# Patient Record
Sex: Female | Born: 1980 | Race: White | Hispanic: Yes | Marital: Single | Smoking: Never smoker
Health system: Southern US, Community
[De-identification: ages and names within clinical notes are randomized; demographics above are authoritative.]

## PROBLEM LIST (undated history)

## (undated) ENCOUNTER — Inpatient Hospital Stay (HOSPITAL_COMMUNITY): Payer: Self-pay

## (undated) DIAGNOSIS — O24419 Gestational diabetes mellitus in pregnancy, unspecified control: Secondary | ICD-10-CM

## (undated) HISTORY — PX: DILATION AND CURETTAGE OF UTERUS: SHX78

---

## 2003-10-17 ENCOUNTER — Ambulatory Visit (HOSPITAL_COMMUNITY): Admission: RE | Admit: 2003-10-17 | Discharge: 2003-10-17 | Payer: Self-pay | Admitting: Obstetrics and Gynecology

## 2003-12-03 ENCOUNTER — Ambulatory Visit (HOSPITAL_COMMUNITY): Admission: RE | Admit: 2003-12-03 | Discharge: 2003-12-03 | Payer: Self-pay | Admitting: *Deleted

## 2003-12-27 ENCOUNTER — Inpatient Hospital Stay (HOSPITAL_COMMUNITY): Admission: AD | Admit: 2003-12-27 | Discharge: 2003-12-27 | Payer: Self-pay | Admitting: *Deleted

## 2004-01-01 ENCOUNTER — Ambulatory Visit (HOSPITAL_COMMUNITY): Admission: RE | Admit: 2004-01-01 | Discharge: 2004-01-01 | Payer: Self-pay | Admitting: *Deleted

## 2004-01-17 ENCOUNTER — Inpatient Hospital Stay (HOSPITAL_COMMUNITY): Admission: AD | Admit: 2004-01-17 | Discharge: 2004-01-17 | Payer: Self-pay | Admitting: Obstetrics & Gynecology

## 2004-02-20 ENCOUNTER — Inpatient Hospital Stay (HOSPITAL_COMMUNITY): Admission: AD | Admit: 2004-02-20 | Discharge: 2004-02-20 | Payer: Self-pay | Admitting: Obstetrics and Gynecology

## 2004-02-24 ENCOUNTER — Inpatient Hospital Stay (HOSPITAL_COMMUNITY): Admission: AD | Admit: 2004-02-24 | Discharge: 2004-02-26 | Payer: Self-pay | Admitting: Obstetrics and Gynecology

## 2009-12-05 ENCOUNTER — Ambulatory Visit (HOSPITAL_COMMUNITY): Admission: RE | Admit: 2009-12-05 | Discharge: 2009-12-05 | Payer: Self-pay | Admitting: Obstetrics & Gynecology

## 2010-04-30 ENCOUNTER — Ambulatory Visit: Payer: Self-pay | Admitting: Obstetrics & Gynecology

## 2010-04-30 ENCOUNTER — Inpatient Hospital Stay (HOSPITAL_COMMUNITY): Admission: AD | Admit: 2010-04-30 | Discharge: 2010-05-02 | Payer: Self-pay | Admitting: Family Medicine

## 2010-10-08 LAB — ABO/RH: ABO/RH(D): O POS

## 2010-10-08 LAB — CBC
Hemoglobin: 12.9 g/dL (ref 12.0–15.0)
Hemoglobin: 13.2 g/dL (ref 12.0–15.0)
MCH: 34.1 pg — ABNORMAL HIGH (ref 26.0–34.0)
MCH: 34.3 pg — ABNORMAL HIGH (ref 26.0–34.0)
Platelets: 111 10*3/uL — ABNORMAL LOW (ref 150–400)
RBC: 3.78 MIL/uL — ABNORMAL LOW (ref 3.87–5.11)
RBC: 3.85 MIL/uL — ABNORMAL LOW (ref 3.87–5.11)
WBC: 13.3 10*3/uL — ABNORMAL HIGH (ref 4.0–10.5)
WBC: 8.9 10*3/uL (ref 4.0–10.5)

## 2010-10-08 LAB — RPR: RPR Ser Ql: NONREACTIVE

## 2010-12-12 NOTE — Op Note (Signed)
Meagan Grant, PECHACEK                          ACCOUNT NO.:  [id number]   MEDICAL RECORD NO.:  [id number]                   PATIENT TYPE:  INP   LOCATION:  9109                                 FACILITY:  [facility]   PHYSICIAN:  Phil D. Okey Dupre, M.D.                  DATE OF BIRTH:  08-24-1980   DATE OF PROCEDURE:  02/24/2004  DATE OF DISCHARGE:                                 OPERATIVE REPORT   PREOPERATIVE DIAGNOSES:  1. Persistent left occipitoposterior presentation.  2. Failure to descend spontaneously.   POSTOPERATIVE DIAGNOSIS:  1. Persistent left occipitoposterior presentation.  2. Failure to descend spontaneously.   PROCEDURE:  Operative delivery.   SURGEON:  Javier Glazier. Okey Dupre, M.D.   INDICATIONS:  The patient is a 30 year old multiparous Hispanic female who  progressed in labor well until in full dilatation at which time the baby  presented in an LOP presentation with clinically plenty of room yet failure  to descend beyond a +3 station.  For that reason, an operative delivery was  decided upon.   DESCRIPTION OF PROCEDURE:  Two attempts with two pulls each was tried with  the vacuum assistance.  However, because of the sharp angle necessitating to  put this on the occiput, the vacuum would not hold.  It was therefore  decided to do a forceps delivery with the head at +3 and a direct posterior,  the Luikart-McLane forceps were easily applied, and the baby easily  delivered over a midline episiotomy.  At the time of dictation, I do not  have a weight.  The baby had a 8/9 Apgar.  As the baby was being delivered,  the forceps were removed.  The head  was _________ of the head to LOT, and  the baby was then easily delivered.  The cord was doubly clamped and  divided.  The baby had was handed to the pediatrician.  The placenta was  spontaneously removed.  The uterus was explored as well as the vagina, and  the episiotomy repaired with a 2-0 microsuture.  This was done under  epidural  anesthesia.                                               Phil D. Okey Dupre, M.D.    PDR/MEDQ  D:  02/24/2004  T:  02/24/2004  Job:  [id number]

## 2017-08-21 ENCOUNTER — Other Ambulatory Visit: Payer: Self-pay

## 2017-08-21 ENCOUNTER — Inpatient Hospital Stay (HOSPITAL_COMMUNITY)
Admission: AD | Admit: 2017-08-21 | Discharge: 2017-08-21 | Disposition: A | Discharge status: Home / Self Care | Payer: Self-pay | Source: Ambulatory Visit | Attending: Obstetrics & Gynecology | Admitting: Obstetrics & Gynecology

## 2017-08-21 ENCOUNTER — Encounter (HOSPITAL_COMMUNITY): Payer: Self-pay | Admitting: *Deleted

## 2017-08-21 ENCOUNTER — Inpatient Hospital Stay (HOSPITAL_COMMUNITY): Payer: Self-pay

## 2017-08-21 DIAGNOSIS — O021 Missed abortion: Secondary | ICD-10-CM

## 2017-08-21 DIAGNOSIS — O209 Hemorrhage in early pregnancy, unspecified: Secondary | ICD-10-CM

## 2017-08-21 DIAGNOSIS — Z3A08 8 weeks gestation of pregnancy: Secondary | ICD-10-CM | POA: Insufficient documentation

## 2017-08-21 LAB — WET PREP, GENITAL
Clue Cells Wet Prep HPF POC: NONE SEEN
Sperm: NONE SEEN
Trich, Wet Prep: NONE SEEN
Yeast Wet Prep HPF POC: NONE SEEN

## 2017-08-21 LAB — URINALYSIS, ROUTINE W REFLEX MICROSCOPIC
BILIRUBIN URINE: NEGATIVE
Glucose, UA: NEGATIVE mg/dL
KETONES UR: NEGATIVE mg/dL
Nitrite: NEGATIVE
PROTEIN: 100 mg/dL — AB
SPECIFIC GRAVITY, URINE: 1.017 (ref 1.005–1.030)
pH: 6 (ref 5.0–8.0)

## 2017-08-21 LAB — CBC
HEMATOCRIT: 35.4 % — AB (ref 36.0–46.0)
HEMOGLOBIN: 11.9 g/dL — AB (ref 12.0–15.0)
MCH: 30.4 pg (ref 26.0–34.0)
MCHC: 33.6 g/dL (ref 30.0–36.0)
MCV: 90.3 fL (ref 78.0–100.0)
Platelets: 177 10*3/uL (ref 150–400)
RBC: 3.92 MIL/uL (ref 3.87–5.11)
RDW: 13.5 % (ref 11.5–15.5)
WBC: 8.8 10*3/uL (ref 4.0–10.5)

## 2017-08-21 LAB — POCT PREGNANCY, URINE: PREG TEST UR: POSITIVE — AB

## 2017-08-21 LAB — HCG, QUANTITATIVE, PREGNANCY: HCG, BETA CHAIN, QUANT, S: 10795 m[IU]/mL — AB (ref <5)

## 2017-08-21 MED ORDER — IBUPROFEN 600 MG PO TABS: 600.0000 mg | ORAL_TABLET | Freq: Four times a day (QID) | ORAL | 0 refills | Status: DC | PRN

## 2017-08-21 MED ORDER — ACETAMINOPHEN-CODEINE #3 300-30 MG PO TABS: 1.0000 | ORAL_TABLET | Freq: Four times a day (QID) | ORAL | 0 refills | Status: DC | PRN

## 2017-08-21 MED ORDER — PROMETHAZINE HCL 12.5 MG PO TABS: 12.5000 mg | ORAL_TABLET | Freq: Four times a day (QID) | ORAL | 0 refills | Status: DC | PRN

## 2017-08-21 MED ORDER — MISOPROSTOL 200 MCG PO TABS: ORAL_TABLET | ORAL | 1 refills | Status: DC

## 2017-08-21 NOTE — MAU Note (Signed)
Pt reports bleeding and watery discharge today. Denies pain. Had a  [facility] at [facility]. And has letter with her today.

## 2017-08-21 NOTE — MAU Provider Note (Signed)
History     CSN: [id number]  Arrival date and time: 08/21/17 1949   First Provider Initiated Contact with Patient 08/21/17 2035      CC:  Vaginal bleeding in pregnancy  HPI Meagan Grant 37 y.o. [redacted]w[redacted]d  Comes to MAU today with vaginal bleeding.  [facility] but has not yet started her prenatal care.  Had bleeding a couple of weeks ago that was light.  Today had more bleeding and darker bleeding but no abdominal pain.  Is worried about the pregnancy.  Blood type is O positive.   OB History    Gravida Para Term Preterm AB Living   4 3 3     3    SAB TAB Ectopic Multiple Live Births           3      Past Medical History:  Diagnosis Date  . Medical history non-contributory     Past Surgical History:  Procedure Laterality Date  . NO PAST SURGERIES      History reviewed. No pertinent family history.  Social History   Tobacco Use  . Smoking status: Never Smoker  . Smokeless tobacco: Never Used  Substance Use Topics  . Alcohol use: No    Frequency: Never  . Drug use: No    Allergies: Allergies not on file  No medications prior to admission.    Review of Systems  Constitutional: Negative for fever.  Gastrointestinal: Negative for abdominal pain, constipation, diarrhea, nausea and vomiting.  Genitourinary: Positive for vaginal bleeding. Negative for dysuria and vaginal discharge.   Physical Exam   Blood pressure 133/67, pulse 79, temperature 98.4 F (36.9 C), temperature source Oral, resp. rate 16, height 5\' 2"  (1.575 m), weight 225 lb (102.1 kg), last menstrual period 06/24/2017, SpO2 100 %.  Physical Exam  Nursing note and vitals reviewed. Constitutional: She is oriented to person, place, and time. She appears well-developed and well-nourished.  HENT:  Head: Normocephalic.  Eyes: EOM are normal.  Neck: Neck supple.  GI: Soft. There is no tenderness. There is no rebound and no guarding.  Genitourinary:   Genitourinary Comments: Speculum exam: Vagina - Small amount of dark blood Cervix - Unable to visualize completely Bimanual exam: Cervix closed Uterus non tender, unable to size due to habitus Adnexa non tender, no masses bilaterally GC/Chlam, wet prep done Chaperone present for exam.   Musculoskeletal: Normal range of motion.  Neurological: She is alert and oriented to person, place, and time.  Skin: Skin is warm and dry.  Psychiatric: She has a normal mood and affect.    MAU Course  Procedures  MDM Wet prep- negative HCG- 10,795  GC/C- pending  Care taken over by Steward Drone CNM @ [address] 08/21/2017, 8:47 PM   Labs results and [place] results reviewed  US Ob Comp Less 14 Wks  Result Date: 08/21/2017 CLINICAL DATA:  Vaginal bleeding. EXAM: OBSTETRIC <14 WK [place] AND TRANSVAGINAL OB US TECHNIQUE: Both transabdominal and transvaginal ultrasound examinations were performed for complete evaluation of the gestation as well as the maternal uterus, adnexal regions, and pelvic cul-de-sac. Transvaginal technique was performed to assess early pregnancy. COMPARISON:  None. FINDINGS: Intrauterine gestational sac: Single; small size noted Yolk sac:  Visualized. Embryo:  Visualized. Cardiac Activity: Not Visualized. CRL:  14 mm   7 w   4 d  US EDC: 04/05/2018 Subchorionic hemorrhage:  None visualized. Maternal uterus/adnexae: Small left ovarian corpus luteum cyst. Nonvisualization of right ovary, however no adnexal mass or abnormal free fluid identified. IMPRESSION: Findings meet definitive criteria for failed pregnancy. This follows SRU consensus guidelines: Diagnostic Criteria for Nonviable Pregnancy Early in the First Trimester. Macy Mis Engl J Med [id number];369:1443-51. Electronically Signed   By: Myles RosenthalJohn  Stahl M.D.   On: 08/21/2017 22:11   [place] Ob Transvaginal  Result Date: 08/21/2017 CLINICAL DATA:  Vaginal bleeding. EXAM: OBSTETRIC <14 WK US AND TRANSVAGINAL OB US TECHNIQUE:  Both transabdominal and transvaginal ultrasound examinations were performed for complete evaluation of the gestation as well as the maternal uterus, adnexal regions, and pelvic cul-de-sac. Transvaginal technique was performed to assess early pregnancy. COMPARISON:  None. FINDINGS: Intrauterine gestational sac: Single; small size noted Yolk sac:  Visualized. Embryo:  Visualized. Cardiac Activity: Not Visualized. CRL:  14 mm   7 w   4 d                  US EDC: 04/05/2018 Subchorionic hemorrhage:  None visualized. Maternal uterus/adnexae: Small left ovarian corpus luteum cyst. Nonvisualization of right ovary, however no adnexal mass or abnormal free fluid identified. IMPRESSION: Findings meet definitive criteria for failed pregnancy. This follows SRU consensus guidelines: Diagnostic Criteria for Nonviable Pregnancy Early in the First Trimester. Macy Mis Engl J Med [phone];369:1443-51. Electronically Signed   By: Myles RosenthalJohn  Stahl M.D.   On: 08/21/2017 22:11   CBC- HGB 11.9 today   Discussed with patient results of US and options of management at this point with expectant management or cytotec for failed pregnancy. Answered patient and husband's questions and concerns. Rhogam not needed with ABO/Rh O Pos. Patient agrees to cytotec management for failed pregnancy.   Reviewed with pt cytotec procedure.  Pt verbalizes that she lives close to the hospital and has transportation readily available.  Medications prescribed for home use. Pt to follow up in the office in 1 week for repeat lab work and 2 weeks for office visit. Pt appears reliable and verbalizes understanding and agrees with plan of care with interpreter at bedside.   Assessment and Plan   1. Missed abortion   2. Vaginal bleeding in pregnancy, first trimester    Discharge home. Pt stable at time of discharge. Follow up in 1 week for repeat lab work and 2 week office visit with provider Return to MAU as needed for increased bleeding, increased pain not relieved by  medication, lightheaded or dizziness.  Rx for Cytotec 800mcg buccal @ home Rx for Ibuprofen, Tylenol #3, and phenergan PRN prescription given to patient    Follow-up Information    [facility] Schedule an appointment as soon as possible for a visit in 1 week(s).   Specialty:  Obstetrics and Gynecology Contact information: [address] [place] [phone]         Allergies as of 08/21/2017   Not on File     Medication List    TAKE these medications   acetaminophen-codeine 300-30 MG tablet Commonly known as:  TYLENOL #3 Take 1-2 tablets by mouth every 6 (six) hours as needed for moderate pain.   ibuprofen 600 MG tablet Commonly known as:  ADVIL,MOTRIN Take 1 tablet (600 mg total) by mouth every 6 (six) hours as needed.   misoprostol 200 MCG tablet Commonly known as:  CYTOTEC Place four tablets in between your gums and cheeks (two tablets on each side) as instructed  promethazine 12.5 MG tablet Commonly known as:  PHENERGAN Take 1 tablet (12.5 mg total) by mouth every 6 (six) hours as needed for nausea or vomiting.      Sharyon Cable, CNM 08/22/17, 12:39 AM

## 2017-08-22 ENCOUNTER — Other Ambulatory Visit: Payer: Self-pay

## 2017-08-22 ENCOUNTER — Inpatient Hospital Stay (HOSPITAL_COMMUNITY)
Admission: AD | Admit: 2017-08-22 | Discharge: 2017-08-23 | Disposition: A | Discharge status: Home / Self Care | Payer: Self-pay | Source: Ambulatory Visit | Attending: Family Medicine | Admitting: Family Medicine

## 2017-08-22 ENCOUNTER — Encounter (HOSPITAL_COMMUNITY): Payer: Self-pay

## 2017-08-22 DIAGNOSIS — O039 Complete or unspecified spontaneous abortion without complication: Secondary | ICD-10-CM

## 2017-08-22 DIAGNOSIS — O021 Missed abortion: Secondary | ICD-10-CM | POA: Insufficient documentation

## 2017-08-22 DIAGNOSIS — Z3A08 8 weeks gestation of pregnancy: Secondary | ICD-10-CM | POA: Insufficient documentation

## 2017-08-22 LAB — CBC
HCT: 32.4 % — ABNORMAL LOW (ref 36.0–46.0)
Hemoglobin: 10.7 g/dL — ABNORMAL LOW (ref 12.0–15.0)
MCH: 30.1 pg (ref 26.0–34.0)
MCHC: 33 g/dL (ref 30.0–36.0)
MCV: 91 fL (ref 78.0–100.0)
PLATELETS: 203 10*3/uL (ref 150–400)
RBC: 3.56 MIL/uL — AB (ref 3.87–5.11)
RDW: 13.6 % (ref 11.5–15.5)
WBC: 12.9 10*3/uL — AB (ref 4.0–10.5)

## 2017-08-22 LAB — TYPE AND SCREEN
ABO/RH(D): O POS
ANTIBODY SCREEN: NEGATIVE

## 2017-08-22 MED ORDER — LACTATED RINGERS IV BOLUS (SEPSIS)
1000.0000 mL | Freq: Once | INTRAVENOUS | Status: AC
  Administered 2017-08-22: 1000 mL via INTRAVENOUS

## 2017-08-22 MED ORDER — MISOPROSTOL 200 MCG PO TABS
1000.0000 ug | ORAL_TABLET | Freq: Once | ORAL | Status: AC
  Administered 2017-08-22: 1000 ug via RECTAL
  Filled 2017-08-22: qty 5

## 2017-08-22 NOTE — Progress Notes (Addendum)
G4P3 @ 8.[redacted] wksga. Was here last night and MAB confirmed. Today around 1pm heavy bleeding started. States saw products of conception in the toilet.   Last night pt was given cytotec buccal   See flow sheet  for VS.

## 2017-08-22 NOTE — MAU Note (Signed)
Pt here with c/o heavy vaginal bleeding. Was seen here last night and miscarriage confirmed. Started bleeding heavily about 1 pm. Pale in color and diaphoretic.

## 2017-08-22 NOTE — MAU Provider Note (Signed)
History     CSN: [id number]  Arrival date and time: 08/22/17 2025   First Provider Initiated Contact with Patient 08/22/17 2120      Chief Complaint  Patient presents with  . Vaginal Bleeding   HPI Meagan Grant is a 37 y.o. [phone] at [redacted]w[redacted]d who presents with vaginal bleeding. Patient was diagnosed with missed AB yesterday & sent home with cytotec. Had IUP measuring [redacted]w[redacted]d with no cardiac activity. States she took the cytotec today around 1 pm & started bleeding heavily at 6 pm. Reports saturating pads & passing clots & tissue. Lower abdominal cramping like contractions. Rates pain 0/10 currently. Denies fever/chill. Reports feeling lightheaded & dizzy upon arrival to MAU like she was going to pass out.   Spanish interpreter at bedside.   OB History    Gravida Para Term Preterm AB Living   4 3 3     3    SAB TAB Ectopic Multiple Live Births           3      Past Medical History:  Diagnosis Date  . Medical history non-contributory     Past Surgical History:  Procedure Laterality Date  . NO PAST SURGERIES      No family history on file.  Social History   Tobacco Use  . Smoking status: Never Smoker  . Smokeless tobacco: Never Used  Substance Use Topics  . Alcohol use: No    Frequency: Never  . Drug use: No    Allergies: No Known Allergies  Medications Prior to Admission  Medication Sig Dispense Refill Last Dose  . acetaminophen-codeine (TYLENOL #3) 300-30 MG tablet Take 1-2 tablets by mouth every 6 (six) hours as needed for moderate pain. 15 tablet 0   . ibuprofen (ADVIL,MOTRIN) 600 MG tablet Take 1 tablet (600 mg total) by mouth every 6 (six) hours as needed. 30 tablet 0   . misoprostol (CYTOTEC) 200 MCG tablet Place four tablets in between your gums and cheeks (two tablets on each side) as instructed 4 tablet 1   . promethazine (PHENERGAN) 12.5 MG tablet Take 1 tablet (12.5 mg total) by mouth every 6 (six) hours as needed for nausea or vomiting. 30 tablet  0     Review of Systems  Constitutional: Negative.   Gastrointestinal: Negative for abdominal pain.  Genitourinary: Positive for vaginal bleeding.  Neurological: Positive for dizziness and light-headedness. Negative for syncope.   Physical Exam   Blood pressure 107/64, pulse (!) 106, temperature 98.9 F (37.2 C), temperature source Oral, resp. rate 18, height 5\' 2"  (1.575 m), last menstrual period 06/24/2017, SpO2 100 %. Patient Vitals for the past 24 hrs:  BP Temp Temp src Pulse Resp SpO2 Height  08/23/17 0340 107/64 - - (!) 106 - - -  08/23/17 0319 112/65 - - (!) 108 - - -  08/23/17 0317 109/64 - - 86 - - -  08/23/17 0227 (!) 100/51 - - 70 - - -  08/23/17 0226 (!) 94/45 - - 70 - 100 % -  08/23/17 0225 (!) 73/54 - - (!) 142 - - -  08/23/17 0210 113/63 - - (!) 107 - - -  08/23/17 0205 110/65 - - 100 - - -  08/23/17 0053 110/60 - - 85 - - -  08/23/17 0052 - 98.9 F (37.2 C) Oral 92 18 100 % -  08/22/17 2337 112/60 - - 90 - - -  08/22/17 2248 113/61 - - 76 - - -  08/22/17 2213 (!) 95/47 - - 77 - - -  08/22/17 2154 - - - - - 100 % -  08/22/17 2149 - - - - - 100 % -  08/22/17 2144 - - - - - 100 % -  08/22/17 2141 (!) 88/59 - - 80 - - -  08/22/17 2140 (!) 88/59 - - 79 - 100 % -  08/22/17 2139 - - - - - 100 % -  08/22/17 2134 - - - - - 98 % -  08/22/17 2129 - - - - - 98 % -  08/22/17 2124 - - - - - 99 % -  08/22/17 2119 - - - - - 96 % -  08/22/17 2114 - - - - - 98 % -  08/22/17 2109 - - - - - 99 % -  08/22/17 2059 - - - - - 99 % -  08/22/17 2054 (!) 95/54 - - 79 - 99 % -  08/22/17 2049 - - - - - 100 % -  08/22/17 2046 (!) 93/52 97.9 F (36.6 C) Oral 92 18 98 % 5\' 2"  (1.575 m)    Physical Exam  Nursing note and vitals reviewed. Constitutional: She is oriented to person, place, and time. She appears well-developed and well-nourished. No distress.  HENT:  Head: Normocephalic and atraumatic.  Eyes: Conjunctivae are normal. Right eye exhibits no discharge. Left eye exhibits  no discharge. No scleral icterus.  Neck: Normal range of motion.  Cardiovascular: Normal rate, regular rhythm and normal heart sounds.  No murmur heard. Respiratory: Effort normal and breath sounds normal. No respiratory distress. She has no wheezes.  GI: Soft. There is no tenderness.  Genitourinary:  Genitourinary Comments: Moderate amount of blood, clots & tissue removed from vagina during exam. Tissue protruding from os; unable to remove with ring forceps  Neurological: She is alert and oriented to person, place, and time.  Skin: Skin is warm and dry. She is not diaphoretic.  Psychiatric: She has a normal mood and affect. Her behavior is normal. Judgment and thought content normal.    MAU Course  Procedures Results for orders placed or performed during the hospital encounter of 08/22/17 (from the past 24 hour(s))  CBC     Status: Abnormal   Collection Time: 08/22/17 10:05 PM  Result Value Ref Range   WBC 12.9 (H) 4.0 - 10.5 K/uL   RBC 3.56 (L) 3.87 - 5.11 MIL/uL   Hemoglobin 10.7 (L) 12.0 - 15.0 g/dL   HCT 16.1 (L) 09.6 - 04.5 %   MCV 91.0 78.0 - 100.0 fL   MCH 30.1 26.0 - 34.0 pg   MCHC 33.0 30.0 - 36.0 g/dL   RDW 40.9 81.1 - 91.4 %   Platelets 203 150 - 400 K/uL  Type and screen     Status: None   Collection Time: 08/22/17 10:05 PM  Result Value Ref Range   ABO/RH(D) O POS    Antibody Screen NEG    Sample Expiration 08/25/2017   CBC     Status: Abnormal   Collection Time: 08/23/17  2:30 AM  Result Value Ref Range   WBC 12.4 (H) 4.0 - 10.5 K/uL   RBC 2.94 (L) 3.87 - 5.11 MIL/uL   Hemoglobin 9.0 (L) 12.0 - 15.0 g/dL   HCT 78.2 (L) 95.6 - 21.3 %   MCV 89.5 78.0 - 100.0 fL   MCH 30.6 26.0 - 34.0 pg   MCHC 34.2 30.0 - 36.0 g/dL  RDW 13.6 11.5 - 15.5 %   Platelets 192 150 - 400 K/uL    MDM IV fluid bolus CBC -- hemoglobin 10.7 C/w Dr. Shawnie PonsPratt. Will give 1000 mcg cytotec rectally & reassess in 2 hours.  Patient reports slight increase in cramping after cytotec but  declines pain medication. Pelvic exam repeated & still unable to remove tissue. Called Dr. Shawnie PonsPratt to come assess patient.  Rest of clot removed by Dr. Shawnie PonsPratt, see her note.  CBC repeated, hemoglobin down to 9. Additional IV fluid bolus ordered. After bolus, VS improved & patient able to tolerate ambulating to the bathroom & in the hallway of MAU.  Assessment and Plan  A:  1. Miscarriage    P: Discharge home Pelvic rest Discussed reasons to return to MAU Office will call to schedule f/u  Meagan Grant 08/22/2017, 9:21 PM

## 2017-08-23 DIAGNOSIS — O039 Complete or unspecified spontaneous abortion without complication: Secondary | ICD-10-CM

## 2017-08-23 LAB — CBC
HCT: 26.3 % — ABNORMAL LOW (ref 36.0–46.0)
HEMOGLOBIN: 9 g/dL — AB (ref 12.0–15.0)
MCH: 30.6 pg (ref 26.0–34.0)
MCHC: 34.2 g/dL (ref 30.0–36.0)
MCV: 89.5 fL (ref 78.0–100.0)
Platelets: 192 10*3/uL (ref 150–400)
RBC: 2.94 MIL/uL — ABNORMAL LOW (ref 3.87–5.11)
RDW: 13.6 % (ref 11.5–15.5)
WBC: 12.4 10*3/uL — ABNORMAL HIGH (ref 4.0–10.5)

## 2017-08-23 LAB — GC/CHLAMYDIA PROBE AMP (~~LOC~~) NOT AT ARMC
CHLAMYDIA, DNA PROBE: NEGATIVE
Neisseria Gonorrhea: NEGATIVE

## 2017-08-23 MED ORDER — LACTATED RINGERS IV BOLUS (SEPSIS)
1000.0000 mL | Freq: Once | INTRAVENOUS | Status: AC
  Administered 2017-08-23: 1000 mL via INTRAVENOUS

## 2017-08-23 MED ORDER — LACTATED RINGERS IV SOLN
INTRAVENOUS | Status: DC
  Administered 2017-08-23: 01:00:00 via INTRAVENOUS

## 2017-08-23 NOTE — Discharge Instructions (Signed)
Aborto espontáneo °(Miscarriage) °El aborto espontáneo es la pérdida de un bebé que no ha nacido.(feto) antes de la semana 20 del embarazo. La causa generalmente es desconocida. °CUIDADOS EN EL HOGAR °· Debe permanecer en cama (reposo en cama) o podrá hacer actividades livianas. Regrese a sus actividades según las indicaciones del médico. °· Pida ayuda con las tareas domésticas. °· Anote cuántos apósitos usa por día. Describa el grado en que están empapados. °· No use tampones. No se higienice la vagina (duchas vaginales) ni tenga relaciones sexuales (coito) hasta que el médico la autorice. °· Sólo debe tomar la medicación según las indicaciones del médico. °· No tome aspirina. °· Cumpla con los controles médicos según las indicaciones. °· Si usted o su pareja tienen problemas con el duelo, hable con su médico. También puede intentar con psicoterapia. Permítase el tiempo suficiente de duelo antes de quedar embarazada nuevamente. ° °SOLICITE AYUDA DE INMEDIATO SI: °· Siente cólicos intensos o dolor en el estómago, en la espalda o en el vientre (abdomen). °· Tiene fiebre. °· Elimina grumos de sangre (coágulos) por la vagina, que tienen el tamaño de una nuez o más. Guarde los coágulos para que el médico los vea. °· Elimina gran cantidad de tejidos por la vagina. Guarde lo que ha eliminado para que su médico lo examine. °· Aumenta el sangrado. °· Observa una secreción espesa, con mal olor (pérdida) que proviene de la vagina. °· Se siente mareada, débil o se desvanece (se desmaya). °· Siente escalofríos. ° °ASEGÚRESE DE QUE: °· Comprende estas instrucciones. °· Controlará su enfermedad. °· Solicitará ayuda de inmediato si no mejora o si empeora. ° °Esta información no tiene como fin reemplazar el consejo del médico. Asegúrese de hacerle al médico cualquier pregunta que tenga. °Document Released: 01/12/2012 Document Revised: 01/12/2012 Document Reviewed: 08/13/2011 °Elsevier Interactive Patient Education © 2017 Elsevier  Inc. ° °

## 2017-08-23 NOTE — MAU Provider Note (Signed)
Patient seen and examined. Small amount of clot removed from cervical os. Bleeding minimal. No further tissue noted. Will attempt to increase activity and if tolerating ok--may discharge.

## 2017-08-30 ENCOUNTER — Other Ambulatory Visit: Payer: Self-pay | Admitting: *Deleted

## 2017-08-30 ENCOUNTER — Other Ambulatory Visit: Payer: Self-pay

## 2017-08-30 DIAGNOSIS — O039 Complete or unspecified spontaneous abortion without complication: Secondary | ICD-10-CM

## 2017-08-31 LAB — BETA HCG QUANT (REF LAB): hCG Quant: 29 m[IU]/mL

## 2017-09-13 ENCOUNTER — Ambulatory Visit (INDEPENDENT_AMBULATORY_CARE_PROVIDER_SITE_OTHER): Payer: Self-pay | Admitting: Advanced Practice Midwife

## 2017-09-13 ENCOUNTER — Encounter: Payer: Self-pay | Admitting: Advanced Practice Midwife

## 2017-09-13 VITALS — BP 116/70 | HR 82 | Ht 61.0 in | Wt 223.5 lb

## 2017-09-13 DIAGNOSIS — O039 Complete or unspecified spontaneous abortion without complication: Secondary | ICD-10-CM

## 2017-09-13 NOTE — Progress Notes (Signed)
Subjective:     Patient ID: Meagan Grant, female   DOB: Oct 15, 1980, 37 y.o.   MRandell PatientRN: [id number] Int # [id number] Meagan CoombesSandra L Marylouise StacksSanchez Grant is a 37 y.o. [phone] who is S/P SAB on 08/21/16. She was given cytotec and then returned to MAU with increased bleeding. She states that she had bleeding for about one week afterward, but has not had any bleeding since. She desires another pregnancy at this time.    Gynecologic Exam  The patient's pertinent negatives include no pelvic pain, vaginal bleeding or vaginal discharge. This is a new problem. The current episode started 1 to 4 weeks ago. The problem has been resolved. The patient is experiencing no pain. Pregnant now: recent SAB. Pertinent negatives include no chills, constipation, diarrhea, dysuria, fever, nausea or vomiting. The vaginal discharge was normal. There has been no bleeding. She is sexually active. She uses nothing (planning a pregnancy ) for contraception. Menstrual history: has not had a cycle since SAB.     Review of Systems  Constitutional: Negative for chills and fever.  Gastrointestinal: Negative for constipation, diarrhea, nausea and vomiting.  Genitourinary: Negative for dyspareunia, dysuria, pelvic pain, vaginal bleeding and vaginal discharge.       Objective:   Physical Exam  Constitutional: She is oriented to person, place, and time. She appears well-developed and well-nourished. No distress.  HENT:  Head: Normocephalic.  Cardiovascular: Normal rate.  Pulmonary/Chest: Effort normal.  Abdominal: Soft. There is no tenderness. There is no rebound.  Neurological: She is alert and oriented to person, place, and time.  Skin: Skin is warm and dry.  Psychiatric: She has a normal mood and affect.  Nursing note and vitals reviewed.      Assessment:     1. Spontaneous abortion       Plan:     HCG, CBC today  FU PRN Emotional support and anticipatory guidance given  OK to try for another pregnancy when desired.

## 2017-09-14 LAB — CBC
HEMATOCRIT: 29.6 % — AB (ref 34.0–46.6)
HEMOGLOBIN: 9.1 g/dL — AB (ref 11.1–15.9)
MCH: 27.2 pg (ref 26.6–33.0)
MCHC: 30.7 g/dL — ABNORMAL LOW (ref 31.5–35.7)
MCV: 88 fL (ref 79–97)
Platelets: 245 10*3/uL (ref 150–379)
RBC: 3.35 x10E6/uL — AB (ref 3.77–5.28)
RDW: 14.8 % (ref 12.3–15.4)
WBC: 8.7 10*3/uL (ref 3.4–10.8)

## 2017-09-14 LAB — HCG, BETA SUBUNIT, QN (SERIAL)

## 2018-02-18 ENCOUNTER — Inpatient Hospital Stay (HOSPITAL_COMMUNITY): Payer: Self-pay

## 2018-02-18 ENCOUNTER — Inpatient Hospital Stay (HOSPITAL_COMMUNITY)
Admission: AD | Admit: 2018-02-18 | Discharge: 2018-02-18 | Disposition: A | Discharge status: Home / Self Care | Payer: Self-pay | Source: Ambulatory Visit | Attending: Obstetrics & Gynecology | Admitting: Obstetrics & Gynecology

## 2018-02-18 ENCOUNTER — Encounter (HOSPITAL_COMMUNITY): Payer: Self-pay | Admitting: *Deleted

## 2018-02-18 DIAGNOSIS — O209 Hemorrhage in early pregnancy, unspecified: Secondary | ICD-10-CM | POA: Insufficient documentation

## 2018-02-18 DIAGNOSIS — O09521 Supervision of elderly multigravida, first trimester: Secondary | ICD-10-CM | POA: Insufficient documentation

## 2018-02-18 DIAGNOSIS — O3481 Maternal care for other abnormalities of pelvic organs, first trimester: Secondary | ICD-10-CM | POA: Insufficient documentation

## 2018-02-18 DIAGNOSIS — O26851 Spotting complicating pregnancy, first trimester: Secondary | ICD-10-CM

## 2018-02-18 DIAGNOSIS — R109 Unspecified abdominal pain: Secondary | ICD-10-CM | POA: Insufficient documentation

## 2018-02-18 DIAGNOSIS — Z3A01 Less than 8 weeks gestation of pregnancy: Secondary | ICD-10-CM

## 2018-02-18 DIAGNOSIS — O2341 Unspecified infection of urinary tract in pregnancy, first trimester: Secondary | ICD-10-CM | POA: Insufficient documentation

## 2018-02-18 DIAGNOSIS — N83202 Unspecified ovarian cyst, left side: Secondary | ICD-10-CM | POA: Insufficient documentation

## 2018-02-18 DIAGNOSIS — O26891 Other specified pregnancy related conditions, first trimester: Secondary | ICD-10-CM | POA: Insufficient documentation

## 2018-02-18 DIAGNOSIS — Z3491 Encounter for supervision of normal pregnancy, unspecified, first trimester: Secondary | ICD-10-CM

## 2018-02-18 DIAGNOSIS — Z3A08 8 weeks gestation of pregnancy: Secondary | ICD-10-CM | POA: Insufficient documentation

## 2018-02-18 DIAGNOSIS — Z862 Personal history of diseases of the blood and blood-forming organs and certain disorders involving the immune mechanism: Secondary | ICD-10-CM | POA: Insufficient documentation

## 2018-02-18 LAB — WET PREP, GENITAL
CLUE CELLS WET PREP: NONE SEEN
Sperm: NONE SEEN
Trich, Wet Prep: NONE SEEN
Yeast Wet Prep HPF POC: NONE SEEN

## 2018-02-18 LAB — URINALYSIS, ROUTINE W REFLEX MICROSCOPIC
BILIRUBIN URINE: NEGATIVE
GLUCOSE, UA: NEGATIVE mg/dL
Ketones, ur: NEGATIVE mg/dL
Nitrite: POSITIVE — AB
PH: 6 (ref 5.0–8.0)
Protein, ur: NEGATIVE mg/dL
Specific Gravity, Urine: 1.01 (ref 1.005–1.030)

## 2018-02-18 LAB — GC/CHLAMYDIA PROBE AMP (~~LOC~~) NOT AT ARMC
CHLAMYDIA, DNA PROBE: NEGATIVE
Neisseria Gonorrhea: NEGATIVE

## 2018-02-18 LAB — HIV ANTIBODY (ROUTINE TESTING W REFLEX): HIV Screen 4th Generation wRfx: NONREACTIVE

## 2018-02-18 LAB — CBC
HCT: 32.6 % — ABNORMAL LOW (ref 36.0–46.0)
Hemoglobin: 10.7 g/dL — ABNORMAL LOW (ref 12.0–15.0)
MCH: 26.3 pg (ref 26.0–34.0)
MCHC: 32.8 g/dL (ref 30.0–36.0)
MCV: 80.1 fL (ref 78.0–100.0)
PLATELETS: 172 10*3/uL (ref 150–400)
RBC: 4.07 MIL/uL (ref 3.87–5.11)
RDW: 20 % — ABNORMAL HIGH (ref 11.5–15.5)
WBC: 5.5 10*3/uL (ref 4.0–10.5)

## 2018-02-18 LAB — POCT PREGNANCY, URINE: Preg Test, Ur: POSITIVE — AB

## 2018-02-18 LAB — HCG, QUANTITATIVE, PREGNANCY: hCG, Beta Chain, Quant, S: 65258 m[IU]/mL — ABNORMAL HIGH (ref <5)

## 2018-02-18 MED ORDER — CEPHALEXIN 500 MG PO CAPS: 500.0000 mg | ORAL_CAPSULE | Freq: Four times a day (QID) | ORAL | 0 refills | Status: DC

## 2018-02-18 NOTE — Discharge Instructions (Signed)
[facility]  ° ° °[facility]       Phone: [phone] ° °[facility] [place] [place] Phone: [phone] ° °[facility] [place]  Phone: [phone] ° °[facility] [place]  Phone: [phone] ° °[facility] [place]  Phone: [phone] ° °[facility]       Phone: [phone] ° °[facility]    Phone: [phone]  ° °[facility] ([place]    Phone: [phone] ° °[facility]    Phone: [phone] ° °[facility]    Phone: [phone] ° °[facility]    Phone: [phone] ° °[facility]       Phone: [phone]  ° °[facility]  Phone: [phone] ° °[facility]    Phone: [phone] ° °[facility]   Phone: [phone] ° °[facility]      Phone: [phone] ° °[facility]    Phone: [phone] ° °

## 2018-02-18 NOTE — MAU Note (Signed)
Pt states she had a +upt. This morning she woke up and noticed blood in the toilet. Has some cramping. Rates 1/10. LMP: 6/1

## 2018-02-18 NOTE — MAU Provider Note (Addendum)
Chief Complaint: Possible Pregnancy; Vaginal Bleeding; and Abdominal Pain   First Provider Initiated Contact with Patient 02/18/18 0732        SUBJECTIVE HPI: Meagan Grant is a 37 y.o. [id number] at [redacted]w[redacted]d by LMP who presents to maternity admissions reporting brown bleeding a week ago and some red blood in toilet this morning.  States has no cramping right now.  Had a miscarriage 6 months ago, first trimester.. She denies vaginal itching/burning, urinary symptoms, h/a, dizziness, n/v, or fever/chills.    Possible Pregnancy  The current episode started in the past 7 days. Pertinent negatives include no abdominal pain, chills or fever. Nothing aggravates the symptoms. She has tried nothing for the symptoms.  Vaginal Bleeding  The patient's primary symptoms include vaginal bleeding. The patient's pertinent negatives include no genital itching, genital lesions, genital odor or pelvic pain. This is a recurrent problem. The current episode started in the past 7 days. The problem occurs intermittently. The patient is experiencing no pain. She is pregnant. Pertinent negatives include no abdominal pain, chills or fever. The vaginal discharge was bloody. The vaginal bleeding is lighter than menses. She has not been passing clots. She has not been passing tissue. Nothing aggravates the symptoms. She has tried nothing for the symptoms.   RN Note: Pt states she had a +upt. This morning she woke up and noticed blood in the toilet. Has some cramping. Rates 1/10. LMP: 6/1    Past Medical History:  Diagnosis Date  . Medical history non-contributory    Past Surgical History:  Procedure Laterality Date  . DILATION AND CURETTAGE OF UTERUS     Social History   Socioeconomic History  . Marital status: Single    Spouse name: Not on file  . Number of children: Not on file  . Years of education: Not on file  . Highest education level: Not on file  Occupational History  . Not on file  Social Needs   . Financial resource strain: Not on file  . Food insecurity:    Worry: Not on file    Inability: Not on file  . Transportation needs:    Medical: Not on file    Non-medical: Not on file  Tobacco Use  . Smoking status: Never Smoker  . Smokeless tobacco: Never Used  Substance and Sexual Activity  . Alcohol use: No    Frequency: Never  . Drug use: No  . Sexual activity: Yes  Lifestyle  . Physical activity:    Days per week: Not on file    Minutes per session: Not on file  . Stress: Not on file  Relationships  . Social connections:    Talks on phone: Not on file    Gets together: Not on file    Attends religious service: Not on file    Active member of club or organization: Not on file    Attends meetings of clubs or organizations: Not on file    Relationship status: Not on file  . Intimate partner violence:    Fear of current or ex partner: Not on file    Emotionally abused: Not on file    Physically abused: Not on file    Forced sexual activity: Not on file  Other Topics Concern  . Not on file  Social History Narrative  . Not on file   No current facility-administered medications on file prior to encounter.    No current outpatient medications on file prior to encounter.  No Known Allergies  I have reviewed patient's Past Medical Hx, Surgical Hx, Family Hx, Social Hx, medications and allergies.   ROS:  Review of Systems  Constitutional: Negative for chills and fever.  Gastrointestinal: Negative for abdominal pain.  Genitourinary: Positive for vaginal bleeding. Negative for pelvic pain.  Neurological: Negative for dizziness.   Review of Systems  Other systems negative   Physical Exam  Physical Exam Patient Vitals for the past 24 hrs:  BP Temp Temp src Pulse Resp SpO2 Height Weight  02/18/18 1000 131/73 - - 86 18 - - -  02/18/18 0655 128/66 98.5 F (36.9 C) Oral 80 16 100 % 5' 2.5" (1.588 m) 225 lb (102.1 kg)   Constitutional: Well-developed,  well-nourished female in no acute distress.  Cardiovascular: normal rate Respiratory: normal effort GI: Abd soft, non-tender. Pos BS x 4 MS: Extremities nontender, no edema, normal ROM Neurologic: Alert and oriented x 4.  GU: Neg CVAT.  PELVIC EXAM: Cervix pink, visually closed, without lesion, scant pink discharge, vaginal walls and external genitalia normal Bimanual exam: Cervix 0/long/high, firm, anterior, neg CMT, uterus nontender, nonenlarged, adnexa without tenderness, enlargement, or mass   LAB RESULTS  --/--/O POS (01/27 2205)  IMAGING  MAU Management/MDM: Ordered usual first trimester r/o ectopic labs.   Pelvic exam and cultures done Will check baseline Ultrasound to rule out ectopic.  This bleeding/pain can represent a normal pregnancy with bleeding, spontaneous abortion or even an ectopic which can be life-threatening.  The process as listed above helps to determine which of these is present.   ASSESSMENT  PLAN Care turned over to oncoming provider  Wynelle Bourgeois CNM, MSN Certified Nurse-Midwife 02/18/2018  8:17 PM   US Ob Comp Less 14 Wks  Result Date: 02/18/2018 CLINICAL DATA:  Vaginal bleeding. 1st trimester pregnancy of unknown anatomic location. EXAM: OBSTETRIC <14 WK [place] AND TRANSVAGINAL OB US TECHNIQUE: Both transabdominal and transvaginal ultrasound examinations were performed for complete evaluation of the gestation as well as the maternal uterus, adnexal regions, and pelvic cul-de-sac. Transvaginal technique was performed to assess early pregnancy. COMPARISON:  None. FINDINGS: Intrauterine gestational sac: Single Yolk sac:  Visualized. Embryo:  Visualized. Cardiac Activity: Visualized. Heart Rate: 169 bpm CRL:  16 mm   8 w   0 d                  [place] EDC: 09/30/2018 Subchorionic hemorrhage:  None visualized. Maternal uterus/adnexae: 4.3 cm simple left ovarian cyst. Nonvisualization of right ovary, however no mass or free fluid identified. IMPRESSION: Single  living IUP measuring 8 weeks 0 days, with [place] EDC of 09/30/2018. No significant maternal uterine or adnexal abnormality identified. Electronically Signed   By: Myles Rosenthal M.D.   On: 02/18/2018 09:28   US Ob Transvaginal  Result Date: 02/18/2018 CLINICAL DATA:  Vaginal bleeding. 1st trimester pregnancy of unknown anatomic location. EXAM: OBSTETRIC <14 WK [place] AND TRANSVAGINAL OB US TECHNIQUE: Both transabdominal and transvaginal ultrasound examinations were performed for complete evaluation of the gestation as well as the maternal uterus, adnexal regions, and pelvic cul-de-sac. Transvaginal technique was performed to assess early pregnancy. COMPARISON:  None. FINDINGS: Intrauterine gestational sac: Single Yolk sac:  Visualized. Embryo:  Visualized. Cardiac Activity: Visualized. Heart Rate: 169 bpm CRL:  16 mm   8 w   0 d                  [place] EDC: 09/30/2018 Subchorionic hemorrhage:  None visualized. Maternal uterus/adnexae: 4.3 cm simple  left ovarian cyst. Nonvisualization of right ovary, however no mass or free fluid identified. IMPRESSION: Single living IUP measuring 8 weeks 0 days, with [place] EDC of 09/30/2018. No significant maternal uterine or adnexal abnormality identified. Electronically Signed   By: Myles RosenthalJohn  Stahl M.D.   On: 02/18/2018 09:28    A/P: 1. Normal IUP (intrauterine pregnancy) on prenatal ultrasound, first trimester   2. Bleeding in early pregnancy   3. Spotting affecting pregnancy in first trimester   4. UTI (urinary tract infection) during pregnancy, first trimester     D/C home with bleeding precautions. Rx for Keflex 500 mg QID x 7 days for UTI.  Eda Royal,  [facility] spanish interpreter used for all communication.     Sharen CounterLisa Leftwich-Kirby, CNM 8:18 PM

## 2018-02-20 LAB — CULTURE, OB URINE: Culture: >=100000 — AB

## 2018-03-15 ENCOUNTER — Other Ambulatory Visit (HOSPITAL_COMMUNITY): Payer: Self-pay | Admitting: Nurse Practitioner

## 2018-03-15 DIAGNOSIS — Z369 Encounter for antenatal screening, unspecified: Secondary | ICD-10-CM

## 2018-03-15 DIAGNOSIS — Z3A13 13 weeks gestation of pregnancy: Secondary | ICD-10-CM

## 2018-03-23 ENCOUNTER — Encounter (HOSPITAL_COMMUNITY): Payer: Self-pay

## 2018-03-30 ENCOUNTER — Ambulatory Visit (HOSPITAL_COMMUNITY): Admission: RE | Admit: 2018-03-30 | Payer: Self-pay | Source: Ambulatory Visit

## 2018-03-30 ENCOUNTER — Other Ambulatory Visit: Payer: Self-pay | Admitting: Obstetrics & Gynecology

## 2018-03-30 ENCOUNTER — Encounter (HOSPITAL_COMMUNITY): Payer: Self-pay

## 2018-03-30 ENCOUNTER — Ambulatory Visit (HOSPITAL_COMMUNITY)
Admission: RE | Admit: 2018-03-30 | Discharge: 2018-03-30 | Disposition: A | Discharge status: Home / Self Care | Payer: Self-pay | Source: Ambulatory Visit | Attending: Obstetrics & Gynecology | Admitting: Obstetrics & Gynecology

## 2018-03-30 ENCOUNTER — Ambulatory Visit (HOSPITAL_COMMUNITY)
Admission: RE | Admit: 2018-03-30 | Discharge: 2018-03-30 | Disposition: A | Discharge status: Home / Self Care | Payer: Self-pay | Source: Ambulatory Visit | Attending: Nurse Practitioner | Admitting: Nurse Practitioner

## 2018-03-30 ENCOUNTER — Other Ambulatory Visit (HOSPITAL_COMMUNITY): Payer: Self-pay | Admitting: Nurse Practitioner

## 2018-03-30 DIAGNOSIS — Z3A14 14 weeks gestation of pregnancy: Secondary | ICD-10-CM | POA: Insufficient documentation

## 2018-03-30 DIAGNOSIS — Z369 Encounter for antenatal screening, unspecified: Secondary | ICD-10-CM

## 2018-03-30 DIAGNOSIS — Z3A13 13 weeks gestation of pregnancy: Secondary | ICD-10-CM

## 2018-03-30 DIAGNOSIS — O09292 Supervision of pregnancy with other poor reproductive or obstetric history, second trimester: Secondary | ICD-10-CM

## 2018-03-30 DIAGNOSIS — O09522 Supervision of elderly multigravida, second trimester: Secondary | ICD-10-CM

## 2018-03-30 DIAGNOSIS — Z363 Encounter for antenatal screening for malformations: Secondary | ICD-10-CM

## 2018-03-30 NOTE — ED Notes (Signed)
Patient in session with Dentist. Interpretor present from [employer].

## 2018-03-31 ENCOUNTER — Other Ambulatory Visit (HOSPITAL_COMMUNITY): Payer: Self-pay | Admitting: *Deleted

## 2018-03-31 DIAGNOSIS — O09522 Supervision of elderly multigravida, second trimester: Secondary | ICD-10-CM

## 2018-04-04 ENCOUNTER — Other Ambulatory Visit (HOSPITAL_COMMUNITY): Payer: Self-pay

## 2018-04-05 ENCOUNTER — Other Ambulatory Visit (HOSPITAL_COMMUNITY): Payer: Self-pay

## 2018-04-25 ENCOUNTER — Other Ambulatory Visit (HOSPITAL_COMMUNITY): Payer: Self-pay | Admitting: Maternal and Fetal Medicine

## 2018-04-25 ENCOUNTER — Ambulatory Visit (HOSPITAL_COMMUNITY)
Admission: RE | Admit: 2018-04-25 | Discharge: 2018-04-25 | Disposition: A | Discharge status: Home / Self Care | Payer: Self-pay | Source: Ambulatory Visit | Attending: Nurse Practitioner | Admitting: Nurse Practitioner

## 2018-04-25 ENCOUNTER — Encounter (HOSPITAL_COMMUNITY): Payer: Self-pay

## 2018-04-25 DIAGNOSIS — Z3A18 18 weeks gestation of pregnancy: Secondary | ICD-10-CM | POA: Insufficient documentation

## 2018-04-25 DIAGNOSIS — O09522 Supervision of elderly multigravida, second trimester: Secondary | ICD-10-CM

## 2018-04-25 DIAGNOSIS — O99212 Obesity complicating pregnancy, second trimester: Secondary | ICD-10-CM | POA: Insufficient documentation

## 2018-04-26 ENCOUNTER — Other Ambulatory Visit (HOSPITAL_COMMUNITY): Payer: Self-pay | Admitting: *Deleted

## 2018-04-26 ENCOUNTER — Ambulatory Visit (HOSPITAL_COMMUNITY): Payer: Self-pay

## 2018-04-26 DIAGNOSIS — O09522 Supervision of elderly multigravida, second trimester: Secondary | ICD-10-CM

## 2018-05-20 ENCOUNTER — Other Ambulatory Visit (HOSPITAL_COMMUNITY): Payer: Self-pay

## 2018-05-23 ENCOUNTER — Encounter (HOSPITAL_COMMUNITY): Payer: Self-pay

## 2018-05-23 ENCOUNTER — Ambulatory Visit (HOSPITAL_COMMUNITY)
Admission: RE | Admit: 2018-05-23 | Discharge: 2018-05-23 | Disposition: A | Discharge status: Home / Self Care | Payer: Self-pay | Source: Ambulatory Visit | Attending: Nurse Practitioner | Admitting: Nurse Practitioner

## 2018-05-23 DIAGNOSIS — Z3A22 22 weeks gestation of pregnancy: Secondary | ICD-10-CM | POA: Insufficient documentation

## 2018-05-23 DIAGNOSIS — O9989 Other specified diseases and conditions complicating pregnancy, childbirth and the puerperium: Secondary | ICD-10-CM | POA: Insufficient documentation

## 2018-05-23 DIAGNOSIS — Z362 Encounter for other antenatal screening follow-up: Secondary | ICD-10-CM

## 2018-05-23 DIAGNOSIS — N1330 Unspecified hydronephrosis: Secondary | ICD-10-CM | POA: Insufficient documentation

## 2018-05-23 DIAGNOSIS — O99212 Obesity complicating pregnancy, second trimester: Secondary | ICD-10-CM

## 2018-05-23 DIAGNOSIS — O09522 Supervision of elderly multigravida, second trimester: Secondary | ICD-10-CM | POA: Insufficient documentation

## 2018-05-24 ENCOUNTER — Other Ambulatory Visit (HOSPITAL_COMMUNITY): Payer: Self-pay | Admitting: *Deleted

## 2018-05-24 DIAGNOSIS — O09523 Supervision of elderly multigravida, third trimester: Secondary | ICD-10-CM

## 2018-05-25 ENCOUNTER — Ambulatory Visit (HOSPITAL_COMMUNITY): Payer: Self-pay

## 2018-07-18 ENCOUNTER — Ambulatory Visit (HOSPITAL_COMMUNITY)
Admission: RE | Admit: 2018-07-18 | Discharge: 2018-07-18 | Disposition: A | Discharge status: Home / Self Care | Payer: Self-pay | Source: Ambulatory Visit | Attending: Nurse Practitioner | Admitting: Nurse Practitioner

## 2018-07-22 ENCOUNTER — Other Ambulatory Visit (HOSPITAL_COMMUNITY): Payer: Self-pay | Admitting: *Deleted

## 2018-07-22 ENCOUNTER — Ambulatory Visit (HOSPITAL_COMMUNITY)
Admission: RE | Admit: 2018-07-22 | Discharge: 2018-07-22 | Disposition: A | Discharge status: Home / Self Care | Payer: Self-pay | Source: Ambulatory Visit | Attending: Nurse Practitioner | Admitting: Nurse Practitioner

## 2018-07-22 ENCOUNTER — Encounter (HOSPITAL_COMMUNITY): Payer: Self-pay

## 2018-07-22 DIAGNOSIS — N1330 Unspecified hydronephrosis: Secondary | ICD-10-CM | POA: Insufficient documentation

## 2018-07-22 DIAGNOSIS — O358XX0 Maternal care for other (suspected) fetal abnormality and damage, not applicable or unspecified: Secondary | ICD-10-CM

## 2018-07-22 DIAGNOSIS — O35EXX0 Maternal care for other (suspected) fetal abnormality and damage, fetal genitourinary anomalies, not applicable or unspecified: Secondary | ICD-10-CM

## 2018-07-22 DIAGNOSIS — O99212 Obesity complicating pregnancy, second trimester: Secondary | ICD-10-CM

## 2018-07-22 DIAGNOSIS — Z3A30 30 weeks gestation of pregnancy: Secondary | ICD-10-CM | POA: Insufficient documentation

## 2018-07-22 DIAGNOSIS — O9989 Other specified diseases and conditions complicating pregnancy, childbirth and the puerperium: Secondary | ICD-10-CM | POA: Insufficient documentation

## 2018-07-22 DIAGNOSIS — O09523 Supervision of elderly multigravida, third trimester: Secondary | ICD-10-CM | POA: Insufficient documentation

## 2018-07-22 DIAGNOSIS — Z362 Encounter for other antenatal screening follow-up: Secondary | ICD-10-CM

## 2018-07-27 NOTE — L&D Delivery Note (Addendum)
Delivery Note HAROLD HOYT is a 38 y.o. [id number] at [redacted]w[redacted]d admitted for IOL for A2GDM.  Labor course: uncomplicated ROM: 0h 63m with clear fluid  At 1840 a viable and healthy boy was delivered via spontaneous vaginal delivery (Presentation: cephalic; ROA  ).  Infant placed directly on mom's abdomen for bonding/skin-to-skin. Delayed cord clamping x , then cord clamped x 2, and cut by FOB.  APGAR pending ; weight: pending at time of note.  40 units of pitocin diluted in 1000cc LR was infused rapidly IV per protocol. The placenta separated spontaneously and delivered via CCT and maternal pushing effort.  It was inspected and appears to be intact with a 3 VC.  Placenta/Cord with the following complications: none.  Intrapartum complications:  Gestational Diabetes on Metformin Anesthesia:  IV sedation Episiotomy: none Lacerations:  n/a Suture Repair: n/a Est. Blood Loss (mL): 50 Sponge and instrument count were correct x2.  Mom to postpartum.  Baby to Couplet care / Skin to Skin. Placenta to L&D. Plans to breast and bottle feed Contraception: condoms, considering IUD Circ: unsure   [facility] 09/24/2018 6:52 PM   I was present and gloved with the student during the delivery. I agree with the note above.   Thressa Sheller DNP, CNM  09/24/18  7:09 PM

## 2018-08-01 ENCOUNTER — Telehealth: Payer: Self-pay | Admitting: Family Medicine

## 2018-08-01 NOTE — Telephone Encounter (Signed)
Called to inform of flu restrictions.

## 2018-08-02 ENCOUNTER — Encounter: Payer: Self-pay | Admitting: *Deleted

## 2018-08-03 ENCOUNTER — Encounter: Payer: Self-pay | Admitting: Obstetrics and Gynecology

## 2018-08-03 ENCOUNTER — Ambulatory Visit (INDEPENDENT_AMBULATORY_CARE_PROVIDER_SITE_OTHER): Payer: Self-pay | Admitting: Obstetrics and Gynecology

## 2018-08-03 VITALS — BP 113/68 | HR 77 | Wt 223.2 lb

## 2018-08-03 DIAGNOSIS — O09523 Supervision of elderly multigravida, third trimester: Secondary | ICD-10-CM

## 2018-08-03 DIAGNOSIS — Z789 Other specified health status: Secondary | ICD-10-CM | POA: Insufficient documentation

## 2018-08-03 DIAGNOSIS — O35EXX0 Maternal care for other (suspected) fetal abnormality and damage, fetal genitourinary anomalies, not applicable or unspecified: Secondary | ICD-10-CM

## 2018-08-03 DIAGNOSIS — O0993 Supervision of high risk pregnancy, unspecified, third trimester: Secondary | ICD-10-CM

## 2018-08-03 DIAGNOSIS — Z3A32 32 weeks gestation of pregnancy: Secondary | ICD-10-CM

## 2018-08-03 DIAGNOSIS — O358XX0 Maternal care for other (suspected) fetal abnormality and damage, not applicable or unspecified: Secondary | ICD-10-CM

## 2018-08-03 DIAGNOSIS — O0990 Supervision of high risk pregnancy, unspecified, unspecified trimester: Secondary | ICD-10-CM | POA: Insufficient documentation

## 2018-08-03 DIAGNOSIS — O24419 Gestational diabetes mellitus in pregnancy, unspecified control: Secondary | ICD-10-CM | POA: Insufficient documentation

## 2018-08-03 DIAGNOSIS — Z862 Personal history of diseases of the blood and blood-forming organs and certain disorders involving the immune mechanism: Secondary | ICD-10-CM

## 2018-08-04 LAB — CBC
HEMATOCRIT: 34.4 % (ref 34.0–46.6)
HEMOGLOBIN: 11.8 g/dL (ref 11.1–15.9)
MCH: 31.1 pg (ref 26.6–33.0)
MCHC: 34.3 g/dL (ref 31.5–35.7)
MCV: 91 fL (ref 79–97)
Platelets: 157 10*3/uL (ref 150–450)
RBC: 3.79 x10E6/uL (ref 3.77–5.28)
RDW: 12.7 % (ref 11.7–15.4)
WBC: 8.3 10*3/uL (ref 3.4–10.8)

## 2018-08-04 NOTE — Progress Notes (Signed)
Prenatal Visit Note Date: 08/03/2018 Clinic: [facility]  Transfer of care visit from [facility] due to GDM (dx on 12/23)  Subjective:  Meagan Grant is a 38 y.o. [phone] at [redacted]w[redacted]d being seen today for ongoing prenatal care.  She is currently monitored for the following issues for this high-risk pregnancy and has History of ITP; Supervision of high risk pregnancy, antepartum; AMA (advanced maternal age) multigravida 35+, third trimester; GDM (gestational diabetes mellitus); Language barrier; and Encounter for repeat ultrasound of fetal pyelectasis in singleton pregnancy, antepartum on their problem list.  Patient reports no complaints.   Contractions: Not present. Vag. Bleeding: None.  Movement: Present. Denies leaking of fluid.   The following portions of the patient's history were reviewed and updated as appropriate: allergies, current medications, past family history, past medical history, past social history, past surgical history and problem list. Problem list updated.  Objective:   Vitals:   08/03/18 1525  BP: 113/68  Pulse: 77  Weight: 223 lb 3.2 oz (101.2 kg)    Fetal Status: Fetal Heart Rate (bpm): 145   Movement: Present     General:  Alert, oriented and cooperative. Patient is in no acute distress.  Skin: Skin is warm and dry. No rash noted.   Cardiovascular: Normal heart rate noted  Respiratory: Normal respiratory effort, no problems with respiration noted  Abdomen: Soft, gravid, appropriate for gestational age. Pain/Pressure: Absent     Pelvic:  Cervical exam deferred        Extremities: Normal range of motion.  Edema: None  Mental Status: Normal mood and affect. Normal behavior. Normal judgment and thought content.   Urinalysis:      Assessment and Plan:  Pregnancy: [id number] at [redacted]w[redacted]d  1. Supervision of high risk pregnancy, antepartum Routine care. Unsure at about [facility]. Getting serial u/s already.  - CBC - Referral to Nutrition and Diabetes  Services  2. AMA (advanced maternal age) multigravida 35+, third trimester No issues  3. Gestational diabetes mellitus (GDM), antepartum, gestational diabetes method of control unspecified Largest prior was 9lbs, no prior h/o GDM. Hasn't seen DM teaching here; she did see nutrition at [facility]. Will set up for asap appt and see patient after this. D/w her re: importnace of BS control and delivery planning - Referral to Nutrition and Diabetes Services  4. Language barrier Interpreter used  5. History of ITP Surveillance cbc today  6. Encounter for repeat ultrasound of fetal pyelectasis in singleton pregnancy, antepartum See above  Preterm labor symptoms and general obstetric precautions including but not limited to vaginal bleeding, contractions, leaking of fluid and fetal movement were reviewed in detail with the patient. Please refer to After Visit Summary for other counseling recommendations.  ROB: 1wk after DM education visit   Lafe Bing, MD

## 2018-08-09 ENCOUNTER — Encounter: Payer: Self-pay | Attending: Obstetrics & Gynecology | Admitting: *Deleted

## 2018-08-09 ENCOUNTER — Ambulatory Visit: Payer: Self-pay | Admitting: *Deleted

## 2018-08-09 NOTE — Progress Notes (Signed)
  Patient was seen on 08/09/2018 for Gestational Diabetes self-management. EDD 2/69/2020. Patient speaks Spanish, live interpretor here for this visit. Patient states no history of GDM. 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 Diet history obtained. Patient eats good variety of all food groups. Beverages include coffee and water.  She [facility]. The following learning objectives were met by the patient :   States the definition of Gestational Diabetes  States why dietary management is important in controlling blood glucose  Describes the effects of carbohydrates on blood glucose levels  Demonstrates ability to create a balanced meal plan  Demonstrates carbohydrate counting   States when to check blood glucose levels  Demonstrates proper blood glucose monitoring techniques  States the effect of stress and exercise on blood glucose levels  States the importance of limiting caffeine and abstaining from alcohol and smoking  Plan:  Aim for 3 Carb Choices per meal (45 grams) +/- 1 either way  Aim for 1-2 Carbs per snack Begin reading food labels for Total Carbohydrate of foods If OK with your MD, consider  increasing your activity level by walking, Arm Chair Exercises or other activity daily as tolerated Begin checking BG before breakfast and 2 hours after first bite of breakfast, lunch and dinner as directed by MD  Bring Log Book/Sheet to every medical appointment   Baby Scripts:  Patient not appropriate for Baby Scripts due to language barrier  Take medication if directed by MD  Blood glucose monitor given: [employer] # [id number] Exp: 02/24/2020 Blood glucose reading: 102 mg/dl after breakfast meal  Patient instructed to monitor glucose levels: FBS: 60 - 95 mg/dl 2 hour: <120 mg/dl  Patient received the following handouts: in [place]  Nutrition Diabetes and Pregnancy  Carbohydrate  Counting List  BG Log Sheet  Patient will be seen for follow-up as needed.

## 2018-08-17 ENCOUNTER — Ambulatory Visit (INDEPENDENT_AMBULATORY_CARE_PROVIDER_SITE_OTHER): Payer: Self-pay | Admitting: Obstetrics & Gynecology

## 2018-08-17 VITALS — BP 118/72 | HR 87 | Wt 219.7 lb

## 2018-08-17 DIAGNOSIS — O0993 Supervision of high risk pregnancy, unspecified, third trimester: Secondary | ICD-10-CM

## 2018-08-17 DIAGNOSIS — O24410 Gestational diabetes mellitus in pregnancy, diet controlled: Secondary | ICD-10-CM

## 2018-08-17 DIAGNOSIS — O0990 Supervision of high risk pregnancy, unspecified, unspecified trimester: Secondary | ICD-10-CM

## 2018-08-17 LAB — POCT URINALYSIS DIP (DEVICE)
Bilirubin Urine: NEGATIVE
GLUCOSE, UA: NEGATIVE mg/dL
HGB URINE DIPSTICK: NEGATIVE
Ketones, ur: NEGATIVE mg/dL
Nitrite: NEGATIVE
Protein, ur: NEGATIVE mg/dL
SPECIFIC GRAVITY, URINE: 1.02 (ref 1.005–1.030)
UROBILINOGEN UA: 0.2 mg/dL (ref 0.0–1.0)
pH: 6 (ref 5.0–8.0)

## 2018-08-17 NOTE — Patient Instructions (Addendum)
Diabetes mellitus gestacional, cuidados personales °Gestational Diabetes Mellitus, Self Care °El cuidado personal después del diagnóstico de diabetes gestacional (diabetes mellitus gestacional) implica mantener el nivel de azúcar en la sangre (glucosa) bajo control. Es posible hacerlo logrando un equilibrio entre los siguientes factores: °· Alimentación. °· Actividad física. °· Cambios en el estilo de vida. °· Medicamentos o insulina, si es necesario. °· Apoyo del equipo de médicos y de otras personas. °La siguiente información explica lo que debe saber para mantener la diabetes gestacional bajo control en su casa. °¿Cuáles son los riesgos? °Si la diabetes gestacional se trata, hay pocas probabilidades de que ocasione problemas. Si no se la controla con tratamiento, puede causar problemas durante el trabajo de parto y el parto, y algunos de esos problemas pueden ser dañinos para el bebé en gestación (feto) y la madre. La diabetes gestacional que no se controla también puede producir problemas respiratorios y bajo nivel de glucemia en el recién nacido. °Las mujeres que tienen diabetes gestacional son más propensas a desarrollar la afección si se embarazan de nuevo y a tener diabetes tipo 2 en el futuro. °Cómo controlar el nivel de glucemia ° °· Contrólese la glucemia todos los días durante el embarazo. Haga esto con la frecuencia que le haya indicado el médico. °· Comuníquese con el médico si la glucemia está por encima de su valor ideal en dos pruebas consecutivas. °El médico establecerá los objetivos personalizados de su tratamiento. Generalmente, el objetivo del tratamiento es mantener los siguientes niveles de glucemia durante el embarazo: °· Antes de las comidas (preprandial): valor de 95 mg/dl (5,3 mmol/l) o inferior. °· Después de las comidas (posprandial): °? Una hora después de una comida: igual o menor que 140 mg/dl (7,8 mmol/l). °? Dos horas después de una comida: igual o menor que 120 mg/dl (6,7  mmol/l). °· Nivel de A1c (hemoglobina A1c): del 6 % al 6,5 %. °Cómo controlar la hiperglucemia y la hipoglucemia °Síntomas de hiperglucemia °La hiperglucemia, también denominada glucemia alta, ocurre cuando la glucemia es demasiado alta. Asegúrese de conocer los signos tempranos de hiperglucemia, por ejemplo: °· Aumento de la sed. °· Hambre. °· Mucho cansancio. °· Necesidad de orinar con mayor frecuencia que lo habitual. °· Visión borrosa. °Síntomas de hipoglucemia °La hipoglucemia, también llamada glucemia baja, ocurre cuando el nivel de glucemia es igual o menor que 70 mg/dl (3,9 mmol/l). El riesgo de hipoglucemia aumenta durante o después de realizar actividad física, mientras duerme, cuando está enferma o si se saltea comidas o no come durante mucho tiempo (ayuna). Entre los síntomas, se pueden incluir los siguientes: °· Hambre. °· Ansiedad. °· Sudoración y piel húmeda. °· Confusión. °· Mareos o sensación de desvanecimiento. °· Somnolencia. °· Náuseas. °· Aumento de la frecuencia cardíaca. °· Dolor de cabeza. °· Visión borrosa. °· Irritabilidad. °· Hormigueo o adormecimiento alrededor de la boca, labios o lengua. °· Cambios en la coordinación. °· Sueño agitado. °· Desmayos. °· Convulsiones. °Es importante conocer los síntomas de la hipoglucemia y tratarla de inmediato. Lleve siempre con usted un refrigerio que contenga 15 gramos de hidratos de carbono de acción rápida para tratar la glucemia baja. Sus familiares y amigos cercanos también deben conocer los síntomas, y comprender cómo tratar la hipoglucemia, en caso de que usted no pueda tratarse a sí mismo. °Tratamiento de la hipoglucemia °Si está alerta y puede tragar de manera segura, siga la regla 15/15, que consiste en lo siguiente: °· Consuma 15 gramos de hidratos de carbono de acción rápida. Hable con su médico acerca   de cunto debera consumir.  Las opciones de accin rpida incluyen: ? Pastillas de glucosa (consuma 15 gramos). ? De 6 a 8 unidades de  caramelos duros. ? De 4 a 6 onzas (de 120 a 150 ml) de jugo de frutas. ? De 4 a 6 onzas (de 120 a 150 ml) de refresco comn (no diettico). ? 1 cucharada (15 ml) de miel o azcar.  Contrlese la glucemia 15 minutos despus de ingerir el hidrato de carbono.  Si este nuevo nivel de glucemia todava es igual o menor que 70 mg/dl (3,9 mmol/l), ingiera nuevamente 15 gramos de un hidrato de carbono.  Si el nivel de glucemia no supera los 70 mg/dl (3,9 mmol/l) despus de 3 intentos, solicite ayuda de inmediato.  Ingiera una comida o una colacin en el transcurso de 1 hora despus de que su nivel de glucemia se haya normalizado. Tratamiento de la hipoglucemia grave La hipoglucemia se considera grave cuando su nivel de glucemia es igual o menor que 54 mg/dl (3 mmol/l). La hipoglucemia grave es Engineer, maintenance (IT). No espere a ver si los sntomas desaparecen. Solicite atencin mdica de inmediato. Comunquese con el servicio de emergencias de su localidad (911 en los [place]). Si tiene hipoglucemia grave y no puede ingerir ningn alimento ni bebida, tal vez deba aplicarse una inyeccin de glucagn. Un familiar o un amigo deben aprender a controlarle el nivel de glucemia y a aplicarle una inyeccin de glucagn. Pregntele al mdico si debera tener disponible un kit de inyecciones de glucagn de Freight forwarder. Es posible que la hipoglucemia grave deba tratarse en un hospital. El tratamiento puede incluir la administracin de glucosa a travs de una va intravenosa. Tambin puede necesitar un tratamiento para tratar la afeccin que est causando la hipoglucemia. Siga estas indicaciones en su casa: Tome sus medicamentos para la diabetes segn las indicaciones.  Si el mdico le recet insulina o medicamentos para la diabetes, tmelos [employer].  No se quede sin insulina ni sin cualquier otro medicamento para la diabetes que tome. Planifique con antelacin para tenerlos siempre a su disposicin.  Si [place]  insulina, ajuste las dosis de insulina en funcin de la cantidad de actividad fsica que realiza y de los alimentos que consume. El mdico le indicar cmo ajustar su dosis. Elija alimentos saludables  [employer] come y bebe tienen una incidencia en la glucemia (y en las dosis de [place], si corresponde). Hacer buenas elecciones ayuda a mantener la diabetes bajo control y a Tax inspector de salud. Un plan de alimentacin saludable incluye consumir protenas magras, hidratos de carbono complejos, frutas y verduras frescas, productos lcteos con bajo contenido de [place] y grasas saludables. Programe una cita con un especialista en alimentacin y nutricin (nutricionista certificado) para que la ayude a Paediatric nurse un plan de alimentacin adecuado para usted. Asegrese de lo siguiente:  Siga las indicaciones del mdico respecto de las restricciones en las comidas o las bebidas.  Beba suficiente lquido como para Theatre manager la orina de color amarillo plido.  Coma refrigerios saludables entre comidas nutritivas.  Mantenga un registro de los hidratos de carbono que consume. Para hacerlo, lea las etiquetas de informacin nutricional y aprenda cules son los tamaos estndares de las porciones de los alimentos.  Siga el plan para los das de enfermedad cuando no pueda comer ni beber normalmente. Elabore este plan por adelantado con el mdico.  Mantngase activa  Haga 30 minutos o ms de actividad fsica por da, o tanta actividad fsica como  le recomiende el mdico durante el [place]. ? Hacer 10 minutos de actividad fsica a partir de 30 minutos despus de cada comida puede ayudar a [employer] de glucemia posprandial.  Si comienza un ejercicio o una actividad nuevos, trabaje con el mdico para ajustar la insulina, los medicamentos o la ingesta de comidas segn sea necesario. Opte por un estilo de vida saludable  No beba alcohol.  No consuma ningn producto que contenga tabaco,  lo que incluye cigarrillos, tabaco de Higher education careers adviser y Psychologist, sport and exercise. Si necesita ayuda para dejar de consumir, consulta al mdico.  Aprenda a manejar el estrs. Si necesita ayuda para lograrlo, consulte a su mdico. Cuide su cuerpo  Mantngase al da con las vacunas.  Lvese los dientes y [place] veces por da, y psese hilo dental una o ms veces por Training and development officer. Visite al dentista una vez cada 6 meses o con ms frecuencia.  Mantenga un peso Tax adviser. Indicaciones generales  [employer] de venta libre y los recetados solamente como se lo haya indicado el mdico.  Hable con el mdico sobre el riesgo de tener presin arterial alta durante el embarazo (preeclampsia o eclampsia).  Comparta su plan de control de la diabetes con sus compaeros de trabajo y de Cytogeneticist, y con las personas con las que [place].  Controle el nivel de cetonas en la orina durante el embarazo cuando est enferma o como se lo haya indicado el mdico.  Lleve con usted una tarjeta o use un brazalete o una medalla que indiquen que tiene diabetes gestacional.  Asista a todas las visitas de control durante el embarazo (prenatales) y despus del parto (posnatales) como se lo haya indicado el mdico. Esto es importante. Procure recibir la atencin que necesita despus del parto  Hgase controlar la glucemia de 4 a 12 semanas despus del parto. Esto se hace con una prueba de tolerancia a la glucosa oral (PTGO).  Hgase controles de deteccin de la diabetes al [facility] cada 3 aos o con la frecuencia que le haya indicado el mdico. Preguntas para hacerle al mdico  Es necesario que me rena con un instructor en el cuidado de la diabetes?  Dnde puedo encontrar un grupo de 46 para mujeres con diabetes gestacional? Dnde buscar ms informacin Para obtener ms informacin sobre la diabetes gestacional, visite los siguientes sitios web:  American Diabetes Association (ADA) (Asociacin  Estadounidense de la Diabetes): www.diabetes.org  Centers for Disease Control and Prevention Librarian, academic) (Centros para Building surveyor y Publishing copy de Arboriculturist): http://www.wolf.info/ Resumen  Contrlese la glucemia todos los das durante el [place]. Haga esto con la frecuencia que le haya indicado el mdico.  Si el mdico le recet insulina o medicamentos para la diabetes, adminstreselos todos los das segn las indicaciones.  Asista a todas las visitas de control durante el embarazo (prenatales) y despus del parto (posnatales) como se lo haya indicado el mdico. Esto es importante.  Hgase controlar la glucemia de 4 a 12 semanas despus del parto. Esta informacin no tiene Marine scientist el consejo del mdico. Asegrese de hacerle al mdico cualquier pregunta que tenga. Document Released: 11/04/2015 Document Revised: 02/25/2018 Document Reviewed: 02/25/2018 Elsevier Interactive Patient Education  2019 [employer].

## 2018-08-17 NOTE — Progress Notes (Signed)
   PRENATAL VISIT NOTE  Subjective:  Meagan Grant is a 38 y.o. [phone] at [redacted]w[redacted]d being seen today for ongoing prenatal care. Patient is Spanish-speaking only, Spanish interpreter present for this encounter.  She is currently monitored for the following issues for this high-risk pregnancy and has History of ITP; Supervision of high risk pregnancy, antepartum; AMA (advanced maternal age) multigravida 35+, third trimester; GDM (gestational diabetes mellitus); Language barrier; and Encounter for repeat ultrasound of fetal pyelectasis in singleton pregnancy, antepartum on their problem list.  Patient reports no complaints.  Contractions: Not present. Vag. Bleeding: None.  Movement: Present. Denies leaking of fluid.   The following portions of the patient's history were reviewed and updated as appropriate: allergies, current medications, past family history, past medical history, past social history, past surgical history and problem list. Problem list updated.  Objective:   Vitals:   08/17/18 0831  BP: 118/72  Pulse: 87  Weight: 219 lb 11.2 oz (99.7 kg)    Fetal Status: Fetal Heart Rate (bpm): 132   Movement: Present     General:  Alert, oriented and cooperative. Patient is in no acute distress.  Skin: Skin is warm and dry. No rash noted.   Cardiovascular: Normal heart rate noted  Respiratory: Normal respiratory effort, no problems with respiration noted  Abdomen: Soft, gravid, appropriate for gestational age.  Pain/Pressure: Present     Pelvic: Cervical exam deferred        Extremities: Normal range of motion.  Edema: None  Mental Status: Normal mood and affect. Normal behavior. Normal judgment and thought content.   Assessment and Plan:  Pregnancy: [id number] at [redacted]w[redacted]d  1. Diet controlled gestational diabetes mellitus (GDM) in third trimester  Abnormal fastings. Patient wants to tighten diet control; and add exercise for now, will reevaluate in one week. Told her she may need  medication if still elevated, discussed insulin vs Metformin. She desires Metformin if needed. Has growth scan in 2 days.  2. Supervision of high risk pregnancy, antepartum Preterm labor symptoms and general obstetric precautions including but not limited to vaginal bleeding, contractions, leaking of fluid and fetal movement were reviewed in detail with the patient. Please refer to After Visit Summary for other counseling recommendations.  Return in about 1 week (around 08/24/2018) for OB Visit (HOB).  Future Appointments  Date Time Provider Department Center  08/19/2018  9:30 AM [facility] [place] 5 WH-MFCUS MFC-US    Jaynie Collins, MD

## 2018-08-19 ENCOUNTER — Encounter (HOSPITAL_COMMUNITY): Payer: Self-pay

## 2018-08-19 ENCOUNTER — Other Ambulatory Visit (HOSPITAL_COMMUNITY): Payer: Self-pay | Admitting: *Deleted

## 2018-08-19 ENCOUNTER — Other Ambulatory Visit (HOSPITAL_COMMUNITY): Payer: Self-pay | Admitting: Obstetrics and Gynecology

## 2018-08-19 ENCOUNTER — Ambulatory Visit (HOSPITAL_COMMUNITY)
Admission: RE | Admit: 2018-08-19 | Discharge: 2018-08-19 | Disposition: A | Discharge status: Home / Self Care | Payer: Self-pay | Source: Ambulatory Visit | Attending: Nurse Practitioner | Admitting: Nurse Practitioner

## 2018-08-19 DIAGNOSIS — O35EXX0 Maternal care for other (suspected) fetal abnormality and damage, fetal genitourinary anomalies, not applicable or unspecified: Secondary | ICD-10-CM

## 2018-08-19 DIAGNOSIS — O09522 Supervision of elderly multigravida, second trimester: Secondary | ICD-10-CM

## 2018-08-19 DIAGNOSIS — Z362 Encounter for other antenatal screening follow-up: Secondary | ICD-10-CM

## 2018-08-19 DIAGNOSIS — O99212 Obesity complicating pregnancy, second trimester: Secondary | ICD-10-CM

## 2018-08-19 DIAGNOSIS — O358XX0 Maternal care for other (suspected) fetal abnormality and damage, not applicable or unspecified: Secondary | ICD-10-CM | POA: Insufficient documentation

## 2018-08-19 DIAGNOSIS — Z3A34 34 weeks gestation of pregnancy: Secondary | ICD-10-CM

## 2018-08-19 DIAGNOSIS — O24410 Gestational diabetes mellitus in pregnancy, diet controlled: Secondary | ICD-10-CM

## 2018-08-26 ENCOUNTER — Ambulatory Visit (HOSPITAL_COMMUNITY)
Admission: RE | Admit: 2018-08-26 | Discharge: 2018-08-26 | Disposition: A | Discharge status: Home / Self Care | Payer: Self-pay | Source: Ambulatory Visit | Attending: Nurse Practitioner | Admitting: Nurse Practitioner

## 2018-08-26 ENCOUNTER — Ambulatory Visit (INDEPENDENT_AMBULATORY_CARE_PROVIDER_SITE_OTHER): Payer: Self-pay | Admitting: Family Medicine

## 2018-08-26 ENCOUNTER — Encounter (HOSPITAL_COMMUNITY): Payer: Self-pay

## 2018-08-26 VITALS — BP 119/70 | HR 76 | Wt 220.2 lb

## 2018-08-26 DIAGNOSIS — O0990 Supervision of high risk pregnancy, unspecified, unspecified trimester: Secondary | ICD-10-CM

## 2018-08-26 DIAGNOSIS — O24410 Gestational diabetes mellitus in pregnancy, diet controlled: Secondary | ICD-10-CM

## 2018-08-26 DIAGNOSIS — O99213 Obesity complicating pregnancy, third trimester: Secondary | ICD-10-CM

## 2018-08-26 DIAGNOSIS — Z3A35 35 weeks gestation of pregnancy: Secondary | ICD-10-CM

## 2018-08-26 DIAGNOSIS — O24415 Gestational diabetes mellitus in pregnancy, controlled by oral hypoglycemic drugs: Secondary | ICD-10-CM

## 2018-08-26 DIAGNOSIS — O359XX0 Maternal care for (suspected) fetal abnormality and damage, unspecified, not applicable or unspecified: Secondary | ICD-10-CM

## 2018-08-26 DIAGNOSIS — O09523 Supervision of elderly multigravida, third trimester: Secondary | ICD-10-CM

## 2018-08-26 DIAGNOSIS — Z789 Other specified health status: Secondary | ICD-10-CM

## 2018-08-26 DIAGNOSIS — O0993 Supervision of high risk pregnancy, unspecified, third trimester: Secondary | ICD-10-CM

## 2018-08-26 HISTORY — DX: Gestational diabetes mellitus in pregnancy, unspecified control: O24.419

## 2018-08-26 LAB — POCT URINALYSIS DIP (DEVICE)
Bilirubin Urine: NEGATIVE
GLUCOSE, UA: NEGATIVE mg/dL
Hgb urine dipstick: NEGATIVE
Ketones, ur: NEGATIVE mg/dL
Leukocytes, UA: NEGATIVE
NITRITE: NEGATIVE
PH: 6 (ref 5.0–8.0)
PROTEIN: NEGATIVE mg/dL
Specific Gravity, Urine: 1.01 (ref 1.005–1.030)
Urobilinogen, UA: 0.2 mg/dL (ref 0.0–1.0)

## 2018-08-26 MED ORDER — METFORMIN HCL 500 MG PO TABS: 500.0000 mg | ORAL_TABLET | Freq: Every day | ORAL | 0 refills | Status: DC

## 2018-08-26 NOTE — Progress Notes (Signed)
   PRENATAL VISIT NOTE Subjective:  Meagan Grant is a 38 y.o. [phone] at [redacted]w[redacted]d being seen today for ongoing prenatal care.  She is currently monitored for the following issues for this high-risk pregnancy and has History of ITP; Supervision of high risk pregnancy, antepartum; AMA (advanced maternal age) multigravida 35+, third trimester; GDM (gestational diabetes mellitus); Language barrier; and Encounter for repeat ultrasound of fetal pyelectasis in singleton pregnancy, antepartum on their problem list.    Patient reports no complaints.  Contractions: Not present. Vag. Bleeding: None.  Movement: Present. Denies leaking of fluid.   The following portions of the patient's history were reviewed and updated as appropriate: allergies, current medications, past family history, past medical history, past social history, past surgical history and problem list. Problem list updated.  Objective:   Vitals:   08/26/18 0923  BP: 119/70  Pulse: 76  Weight: 220 lb 3.2 oz (99.9 kg)   Fetal Status: Fetal Heart Rate (bpm): 154   Movement: Present     General:  Alert, oriented and cooperative. Patient is in no acute distress.  Skin: Skin is warm and dry. No rash noted.   Cardiovascular: Normal heart rate noted  Respiratory: Normal respiratory effort, no problems with respiration noted  Abdomen: Soft, gravid, appropriate for gestational age.  Pain/Pressure: Present     Pelvic: Cervical exam deferred        Extremities: Normal range of motion.  Edema: None  Mental Status: Normal mood and affect. Normal behavior. Normal judgment and thought content.   Assessment and Plan:  Pregnancy: [id number] at [redacted]w[redacted]d  Supervision of high risk pregnancy, antepartum -- prenatal record reviewed and UTD, lab abstraction completed -- needs GBS and repeat CBC at next visit   Gestational diabetes mellitus (GDM) in third trimester -- persistently elevated fasting BG values  growth U/S at 34w6 (3624g > 90%),  getting weekly BPPs -- will start metformin 500mg  nightly -- counseled on continuing to check BG ACHS   Language barrier -- used in-person Spanish interpreter for visit   Preterm labor symptoms and general obstetric precautions including but not limited to vaginal bleeding, contractions, leaking of fluid and fetal movement were reviewed in detail with the patient. Please refer to After Visit Summary for other counseling recommendations.   Future Appointments  Date Time Provider Department Center  09/01/2018  4:15 PM Hermina Staggers, MD [facility]  09/02/2018  8:30 AM [facility]-M[facility] [place] 1 WH-MFCUS MFC-US  09/08/2018  8:00 AM WH-MFC [place] 3 WH-MFCUS MFC-US  09/08/2018  9:35 AM Adam Phenix, MD [facility] WOC  09/15/2018  3:35 PM Adam Phenix, MD [facility]    Tamera Stands, DO

## 2018-09-01 ENCOUNTER — Ambulatory Visit (INDEPENDENT_AMBULATORY_CARE_PROVIDER_SITE_OTHER): Payer: Self-pay | Admitting: Obstetrics and Gynecology

## 2018-09-01 ENCOUNTER — Encounter: Payer: Self-pay | Admitting: Obstetrics and Gynecology

## 2018-09-01 VITALS — BP 127/73 | HR 80 | Wt 224.0 lb

## 2018-09-01 DIAGNOSIS — O24410 Gestational diabetes mellitus in pregnancy, diet controlled: Secondary | ICD-10-CM

## 2018-09-01 DIAGNOSIS — O0990 Supervision of high risk pregnancy, unspecified, unspecified trimester: Secondary | ICD-10-CM

## 2018-09-01 DIAGNOSIS — O0993 Supervision of high risk pregnancy, unspecified, third trimester: Secondary | ICD-10-CM

## 2018-09-01 DIAGNOSIS — Z113 Encounter for screening for infections with a predominantly sexual mode of transmission: Secondary | ICD-10-CM

## 2018-09-01 DIAGNOSIS — O09523 Supervision of elderly multigravida, third trimester: Secondary | ICD-10-CM

## 2018-09-01 NOTE — Progress Notes (Signed)
   PRENATAL VISIT NOTE  Subjective:  Meagan Grant is a 38 y.o. [phone] at [redacted]w[redacted]d being seen today for ongoing prenatal care.  She is currently monitored for the following issues for this high-risk pregnancy and has History of ITP; Supervision of high risk pregnancy, antepartum; AMA (advanced maternal age) multigravida 35+, third trimester; GDM (gestational diabetes mellitus); Language barrier; and Encounter for repeat ultrasound of fetal pyelectasis in singleton pregnancy, antepartum on their problem list.  Patient reports no complaints.  Contractions: Not present. Vag. Bleeding: None.  Movement: Present. Denies leaking of fluid.   The following portions of the patient's history were reviewed and updated as appropriate: allergies, current medications, past family history, past medical history, past social history, past surgical history and problem list. Problem list updated.  Objective:   Vitals:   09/01/18 1616  BP: 127/73  Pulse: 80  Weight: 224 lb (101.6 kg)    Fetal Status: Fetal Heart Rate (bpm): 147 Fundal Height: 38 cm Movement: Present  Presentation: Vertex  General:  Alert, oriented and cooperative. Patient is in no acute distress.  Skin: Skin is warm and dry. No rash noted.   Cardiovascular: Normal heart rate noted  Respiratory: Normal respiratory effort, no problems with respiration noted  Abdomen: Soft, gravid, appropriate for gestational age.  Pain/Pressure: Present     Pelvic: Cervical exam performed Dilation: Closed Effacement (%): Thick Station: Ballotable  Extremities: Normal range of motion.  Edema: None  Mental Status: Normal mood and affect. Normal behavior. Normal judgment and thought content.   Assessment and Plan:  Pregnancy: [id number] at 109w5d  1. Supervision of high risk pregnancy, antepartum Patient is doing well without complaints Cultures today CBC today to assess platelet count - CBC - Culture, beta strep (group b only) - Cervicovaginal  ancillary only( [name])  2. Diet controlled gestational diabetes mellitus (GDM) in third trimester CBGs reviewed and fasting as high as 96, pp all within range Patient did not start Metformin as she filled the prescription this yesterday. She plans to start this evening BPP scheduled tomorrow Plan for IOL at 39 weeks  3. AMA (advanced maternal age) multigravida 35+, third trimester   Preterm labor symptoms and general obstetric precautions including but not limited to vaginal bleeding, contractions, leaking of fluid and fetal movement were reviewed in detail with the patient. Please refer to After Visit Summary for other counseling recommendations.  No follow-ups on file.  Future Appointments  Date Time Provider Department Center  09/02/2018  8:30 AM WH-MFC [place] 1 WH-MFCUS MFC-US  09/08/2018  8:00 AM WH-MFC [place] 3 WH-MFCUS MFC-US  09/08/2018  9:35 AM Adam Phenix, MD WOC-WOCA WOC  09/15/2018  3:35 PM Adam Phenix, MD [facility]    Catalina Antigua, MD

## 2018-09-01 NOTE — Progress Notes (Signed)
Interpreter id # [id number] Norva Pavlov

## 2018-09-02 ENCOUNTER — Encounter (HOSPITAL_COMMUNITY): Payer: Self-pay

## 2018-09-02 ENCOUNTER — Ambulatory Visit (HOSPITAL_COMMUNITY)
Admission: RE | Admit: 2018-09-02 | Discharge: 2018-09-02 | Disposition: A | Discharge status: Home / Self Care | Payer: Self-pay | Source: Ambulatory Visit | Attending: Nurse Practitioner | Admitting: Nurse Practitioner

## 2018-09-02 DIAGNOSIS — O24415 Gestational diabetes mellitus in pregnancy, controlled by oral hypoglycemic drugs: Secondary | ICD-10-CM

## 2018-09-02 DIAGNOSIS — O09523 Supervision of elderly multigravida, third trimester: Secondary | ICD-10-CM

## 2018-09-02 DIAGNOSIS — O359XX0 Maternal care for (suspected) fetal abnormality and damage, unspecified, not applicable or unspecified: Secondary | ICD-10-CM

## 2018-09-02 DIAGNOSIS — Z3A36 36 weeks gestation of pregnancy: Secondary | ICD-10-CM

## 2018-09-02 DIAGNOSIS — O99213 Obesity complicating pregnancy, third trimester: Secondary | ICD-10-CM

## 2018-09-02 DIAGNOSIS — O24410 Gestational diabetes mellitus in pregnancy, diet controlled: Secondary | ICD-10-CM | POA: Insufficient documentation

## 2018-09-02 LAB — CBC
HEMOGLOBIN: 11.5 g/dL (ref 11.1–15.9)
Hematocrit: 34.1 % (ref 34.0–46.6)
MCH: 31.5 pg (ref 26.6–33.0)
MCHC: 33.7 g/dL (ref 31.5–35.7)
MCV: 93 fL (ref 79–97)
PLATELETS: 151 10*3/uL (ref 150–450)
RBC: 3.65 x10E6/uL — AB (ref 3.77–5.28)
RDW: 13.2 % (ref 11.7–15.4)
WBC: 7 10*3/uL (ref 3.4–10.8)

## 2018-09-03 LAB — CERVICOVAGINAL ANCILLARY ONLY
Chlamydia: NEGATIVE
Neisseria Gonorrhea: NEGATIVE

## 2018-09-05 LAB — CULTURE, BETA STREP (GROUP B ONLY): Strep Gp B Culture: NEGATIVE

## 2018-09-08 ENCOUNTER — Encounter: Payer: Self-pay | Admitting: Obstetrics & Gynecology

## 2018-09-08 ENCOUNTER — Encounter (HOSPITAL_COMMUNITY): Payer: Self-pay

## 2018-09-08 ENCOUNTER — Ambulatory Visit (INDEPENDENT_AMBULATORY_CARE_PROVIDER_SITE_OTHER): Payer: Self-pay | Admitting: Obstetrics & Gynecology

## 2018-09-08 ENCOUNTER — Ambulatory Visit (HOSPITAL_COMMUNITY)
Admission: RE | Admit: 2018-09-08 | Discharge: 2018-09-08 | Disposition: A | Discharge status: Home / Self Care | Payer: Self-pay | Source: Ambulatory Visit | Attending: Nurse Practitioner | Admitting: Nurse Practitioner

## 2018-09-08 ENCOUNTER — Other Ambulatory Visit (HOSPITAL_COMMUNITY): Payer: Self-pay | Admitting: Obstetrics and Gynecology

## 2018-09-08 DIAGNOSIS — Z3A37 37 weeks gestation of pregnancy: Secondary | ICD-10-CM

## 2018-09-08 DIAGNOSIS — O24410 Gestational diabetes mellitus in pregnancy, diet controlled: Secondary | ICD-10-CM | POA: Insufficient documentation

## 2018-09-08 DIAGNOSIS — O09523 Supervision of elderly multigravida, third trimester: Secondary | ICD-10-CM

## 2018-09-08 DIAGNOSIS — O99213 Obesity complicating pregnancy, third trimester: Secondary | ICD-10-CM

## 2018-09-08 DIAGNOSIS — Z3A36 36 weeks gestation of pregnancy: Secondary | ICD-10-CM

## 2018-09-08 DIAGNOSIS — O24415 Gestational diabetes mellitus in pregnancy, controlled by oral hypoglycemic drugs: Secondary | ICD-10-CM

## 2018-09-08 DIAGNOSIS — O359XX0 Maternal care for (suspected) fetal abnormality and damage, unspecified, not applicable or unspecified: Secondary | ICD-10-CM

## 2018-09-08 DIAGNOSIS — O24435 Gestational diabetes mellitus in puerperium, controlled by oral hypoglycemic drugs: Secondary | ICD-10-CM

## 2018-09-08 MED ORDER — METFORMIN HCL 1000 MG PO TABS: 1000.0000 mg | ORAL_TABLET | Freq: Every day | ORAL | 1 refills | Status: DC

## 2018-09-08 NOTE — Patient Instructions (Signed)
Induccin del trabajo de parto  Labor Induction    Se denomina induccin del trabajo de parto cuando se inician acciones para hacer que una mujer embarazada comience el trabajo de parto. La mayora de las mujeres comienzan el trabajo de parto de forma natural entre las semanas 37 y 42 del embarazo. Cuando esto no ocurre o cuando por necesidad mdica se debe iniciarlo por otros medios, se podran utilizar diferentes mtodos. La induccin del trabajo de parto hace que el tero se contraiga. Tambin hace que el cuello uterino se ablande (madure), se abra (se dilate), y se afine (se borre). Generalmente, el trabajo de parto no se induce antes de las 39semanas, excepto que haya un motivo mdico para hacerlo. El mdico determinar si se debe inducir el parto.  Antes de inducir el trabajo de parto, el mdico considerar ciertos factores; entre ellos:   Su cuadro clnico y el del beb.   En cul semana del embarazo se encuentra.   La madurez de los pulmones del beb.   El estado del cuello uterino.   La posicin del beb.   El tamao del canal del parto.  Cules son algunos de los motivos para inducir un parto?  Se podra inducir un parto en los siguientes casos:   Su salud o la salud del beb estn en riesgo.   El embarazo se pas de trmino 1semana o ms.   Rompi la bolsa, pero no se inici el trabajo de parto de forma natural.   La cantidad de lquido amnitico que rodea al beb es poca.  Tambin podra optar por (elegir) que le induzcan el trabajo de parto en un determinado momento. Por lo general, la induccin del trabajo de parto por eleccin no se hace antes de las 39semanas del embarazo.  Qu mtodos se usan para inducir el trabajo de parto?  Los mtodos utilizados para inducir el trabajo de parto incluyen los siguientes:   Administracin de prostaglandina. Este medicamento hace que se inicien las contracciones y que el cuello uterino se dilate y aliste. Puede tomarse por boca (de forma oral) o  introducirse en la vagina (supositorio).   Insercin de un pequeo tubo delgado (catter) que tiene un baln en el extremo en la vagina; luego, expansin del baln con agua para dilatar el cuello uterino.   Ruptura de las membranas. En este mtodo, el mdico separa, con cuidado, el tejido del saco amnitico del cuello uterino. En consecuencia, el cuello uterino se expande, lo que, a la vez, provoca la liberacin de una hormona llamada progesterona. Esta hormona hace que el tero se contraiga. Este procedimiento suele realizarse en el consultorio del mdico; luego, la enviarn a su hogar para esperar a que comiencen las contracciones.   Romper la bolsa de las aguas. En este mtodo, el mdico usa un pequeo instrumento para hacer un pequeo orificio en el saco amnitico. Al cabo de un tiempo, esto har que el saco amnitico se rompa. Las contracciones deberan comenzar algunas horas despus.   Medicamentos que desencadenen o intensifiquen las contracciones. Estos se administran a travs de una va intravenosa (IV) que se coloca en una vena del brazo.  Excepto la ruptura de las membranas, que puede realizarse en una clnica, la induccin del trabajo de parto se realiza en el hospital para que puedan controlarlos con atencin a usted y al beb.  Cunto tiempo lleva inducir el trabajo de parto?  La duracin del proceso de induccin depende de la preparacin del cuerpo para el   trabajo de parto. Algunas inducciones pueden durar hasta 2 o 3das, mientras que otras podran durar menos de un da. La induccin podra durar ms en los siguientes casos:   La induccin se hace en una etapa temprana del embarazo.   Es el primer embarazo de la madre.   El cuello uterino no est listo.  Cules son algunos de los riesgos asociados con la induccin del trabajo de parto?  Algunos de los riesgos asociados con la induccin del trabajo de parto son los siguientes:   Cambios en la frecuencia cardaca fetal, por ejemplo, los  latidos son demasiado rpidos o lentos, o son irregulares (errticos).   Fracaso de la induccin.   Infeccin en la madre o el beb.   Aumento de la posibilidad de que sea necesaria una cesrea.   Muerte fetal.   Ruptura (desprendimiento) de la placenta del tero (raro).   Ruptura del tero (muy poco frecuente).  Cuando es necesario realizar una induccin por motivos mdicos, los beneficios suelen superar los riesgos.  Cules son algunos de los motivos para no inducir el trabajo de parto?  La induccin no debe realizarse si:   El beb no tolera las contracciones.   Se someti anteriormente a cirugas en el tero, como una miomectoma, le extirparon fibromas o tiene una cicatriz vertical de un parto por cesrea anterior.   La placenta est en una posicin muy baja en el tero y obstruye la abertura del cuello uterino (placenta previa).   El beb no est ubicado con la cabeza hacia bajo.   El cordn umbilical cae hacia el canal del parto, adelante del beb.   Hay circunstancias poco habituales, como que se encuentre en una etapa muy temprana del embarazo (beb prematuro).   Tuvo ms de 2partos por cesrea anteriormente.  Resumen   Se denomina induccin del trabajo de parto cuando se inician acciones para hacer que una mujer embarazada comience el trabajo de parto.   La induccin del trabajo de parto hace que el tero se contraiga. Tambin hace que el cuello uterino se aliste, se dilate y se borre.   El trabajo de parto no se induce antes de las 39semanas de gestacin, excepto que haya un motivo mdico para hacerlo.   Cuando es necesario realizar una induccin por motivos mdicos, los beneficios suelen superar los riesgos.  Esta informacin no tiene como fin reemplazar el consejo del mdico. Asegrese de hacerle al mdico cualquier pregunta que tenga.  Document Released: 10/20/2007 Document Revised: 03/23/2017 Document Reviewed: 03/23/2017  Elsevier Interactive Patient Education  2019 Elsevier Inc[employer]

## 2018-09-08 NOTE — Progress Notes (Signed)
   PRENATAL VISIT NOTE  Subjective:  Meagan Grant is a 38 y.o. [phone] at [redacted]w[redacted]d being seen today for ongoing prenatal care.  She is currently monitored for the following issues for this high-risk pregnancy and has History of ITP; Supervision of high risk pregnancy, antepartum; AMA (advanced maternal age) multigravida 35+, third trimester; GDM (gestational diabetes mellitus); Language barrier; and Encounter for repeat ultrasound of fetal pyelectasis in singleton pregnancy, antepartum on their problem list.  Patient reports needs testing supplies.  Contractions: Irregular. Vag. Bleeding: None.  Movement: Present. Denies leaking of fluid.   The following portions of the patient's history were reviewed and updated as appropriate: allergies, current medications, past family history, past medical history, past social history, past surgical history and problem list. Problem list updated.  Objective:   Vitals:   09/08/18 1010  BP: 100/63  Pulse: 71  Weight: 223 lb 6.4 oz (101.3 kg)    Fetal Status: Fetal Heart Rate (bpm): 141   Movement: Present     General:  Alert, oriented and cooperative. Patient is in no acute distress.  Skin: Skin is warm and dry. No rash noted.   Cardiovascular: Normal heart rate noted  Respiratory: Normal respiratory effort, no problems with respiration noted  Abdomen: Soft, gravid, appropriate for gestational age.  Pain/Pressure: Present     Pelvic: Cervical exam deferred        Extremities: Normal range of motion.  Edema: None  Mental Status: Normal mood and affect. Normal behavior. Normal judgment and thought content.   Assessment and Plan:  Pregnancy: [id number] at [redacted]w[redacted]d  1. Gestational diabetes mellitus (GDM) during puerperium controlled on oral hypoglycemic therapy [place] reviewed and dating revised  2. Diet controlled gestational diabetes mellitus (GDM) in third trimester LGA baby, FBS 90's to 100, increase dose - metFORMIN (GLUCOPHAGE) 1000 MG tablet;  Take 1 tablet (1,000 mg total) by mouth at bedtime.  Dispense: 30 tablet; Refill: 1  Preterm labor symptoms and general obstetric precautions including but not limited to vaginal bleeding, contractions, leaking of fluid and fetal movement were reviewed in detail with the patient. Please refer to After Visit Summary for other counseling recommendations.  Return in about 1 week (around 09/15/2018).  Future Appointments  Date Time Provider Department Center  09/12/2018  8:15 AM [facility]ST [facility]  09/15/2018  3:35 PM Adam Phenix, MD [facility]    Scheryl Darter, MD

## 2018-09-12 ENCOUNTER — Other Ambulatory Visit: Payer: Self-pay

## 2018-09-15 ENCOUNTER — Ambulatory Visit: Payer: Self-pay

## 2018-09-15 ENCOUNTER — Encounter (HOSPITAL_COMMUNITY): Payer: Self-pay | Admitting: *Deleted

## 2018-09-15 ENCOUNTER — Ambulatory Visit (INDEPENDENT_AMBULATORY_CARE_PROVIDER_SITE_OTHER): Payer: Self-pay | Admitting: Obstetrics & Gynecology

## 2018-09-15 ENCOUNTER — Telehealth (HOSPITAL_COMMUNITY): Payer: Self-pay | Admitting: *Deleted

## 2018-09-15 ENCOUNTER — Ambulatory Visit (INDEPENDENT_AMBULATORY_CARE_PROVIDER_SITE_OTHER): Payer: Self-pay | Admitting: *Deleted

## 2018-09-15 VITALS — BP 119/75 | HR 81 | Wt 225.5 lb

## 2018-09-15 DIAGNOSIS — O24435 Gestational diabetes mellitus in puerperium, controlled by oral hypoglycemic drugs: Secondary | ICD-10-CM

## 2018-09-15 DIAGNOSIS — O24419 Gestational diabetes mellitus in pregnancy, unspecified control: Secondary | ICD-10-CM

## 2018-09-15 DIAGNOSIS — O0990 Supervision of high risk pregnancy, unspecified, unspecified trimester: Secondary | ICD-10-CM

## 2018-09-15 DIAGNOSIS — O09523 Supervision of elderly multigravida, third trimester: Secondary | ICD-10-CM

## 2018-09-15 DIAGNOSIS — Z3A37 37 weeks gestation of pregnancy: Secondary | ICD-10-CM

## 2018-09-15 DIAGNOSIS — Z789 Other specified health status: Secondary | ICD-10-CM

## 2018-09-15 DIAGNOSIS — O0993 Supervision of high risk pregnancy, unspecified, third trimester: Secondary | ICD-10-CM

## 2018-09-15 NOTE — Progress Notes (Signed)
   PRENATAL VISIT NOTE  Subjective:  Meagan Grant is a 38 y.o. [phone] at [redacted]w[redacted]d being seen today for ongoing prenatal care.  She is currently monitored for the following issues for this high-risk pregnancy and has History of ITP; Supervision of high risk pregnancy, antepartum; AMA (advanced maternal age) multigravida 35+, third trimester; GDM (gestational diabetes mellitus); Language barrier; and Encounter for repeat ultrasound of fetal pyelectasis in singleton pregnancy, antepartum on their problem list.  Patient reports no complaints.   .  .   . Denies leaking of fluid.   The following portions of the patient's history were reviewed and updated as appropriate: allergies, current medications, past family history, past medical history, past social history, past surgical history and problem list. Problem list updated.  Objective:  There were no vitals filed for this visit.  Fetal Status:           General:  Alert, oriented and cooperative. Patient is in no acute distress.  Skin: Skin is warm and dry. No rash noted.   Cardiovascular: Normal heart rate noted  Respiratory: Normal respiratory effort, no problems with respiration noted  Abdomen: Soft, gravid, appropriate for gestational age.        Pelvic: Cervical exam deferred        Extremities: Normal range of motion.     Mental Status: Normal mood and affect. Normal behavior. Normal judgment and thought content.   Assessment and Plan:  Pregnancy: [id number] at [redacted]w[redacted]d  1. AMA (advanced maternal age) multigravida 35+, third trimester   2. Language barrier - Live interpretor  3. Supervision of high risk pregnancy, antepartum   4. Gestational diabetes mellitus (GDM), antepartum, gestational diabetes method of control unspecified -fantastic sugars on metformin 1000 mg at bedtime  Term labor symptoms and general obstetric precautions including but not limited to vaginal bleeding, contractions, leaking of fluid and fetal movement  were reviewed in detail with the patient. Please refer to After Visit Summary for other counseling recommendations.  No follow-ups on file.  Future Appointments  Date Time Provider Department Center  09/15/2018  3:35 PM Allie Bossier, MD [facility]    Allie Bossier, MD

## 2018-09-15 NOTE — Progress Notes (Signed)
   PRENATAL VISIT NOTE  Subjective:  Meagan Grant is a 38 y.o. [phone] at [redacted]w[redacted]d being seen today for ongoing prenatal care.  She is currently monitored for the following issues for this high-risk pregnancy and has History of ITP; Supervision of high risk pregnancy, antepartum; AMA (advanced maternal age) multigravida 35+, third trimester; GDM (gestational diabetes mellitus); Language barrier; and Encounter for repeat ultrasound of fetal pyelectasis in singleton pregnancy, antepartum on their problem list.  Patient reports no complaints.   .  .   . Denies leaking of fluid.   The following portions of the patient's history were reviewed and updated as appropriate: allergies, current medications, past family history, past medical history, past social history, past surgical history and problem list. Problem list updated.  Objective:  There were no vitals filed for this visit.  Fetal Status: Fetal Heart Rate (bpm): NST         General:  Alert, oriented and cooperative. Patient is in no acute distress.  Skin: Skin is warm and dry. No rash noted.   Cardiovascular: Normal heart rate noted  Respiratory: Normal respiratory effort, no problems with respiration noted  Abdomen: Soft, gravid, appropriate for gestational age.        Pelvic: Cervical exam performed        Extremities: Normal range of motion.     Mental Status: Normal mood and affect. Normal behavior. Normal judgment and thought content.   Assessment and Plan:  Pregnancy: [id number] at [redacted]w[redacted]d  1. AMA (advanced maternal age) multigravida 35+, third trimester   2. Language barrier - Live interpretor  3. Supervision of high risk pregnancy, antepartum   4. Gestational diabetes mellitus (GDM), antepartum, gestational diabetes method of control unspecified - excellent sugars - IOL scheduled  Term labor symptoms and general obstetric precautions including but not limited to vaginal bleeding, contractions, leaking of fluid and  fetal movement were reviewed in detail with the patient. Please refer to After Visit Summary for other counseling recommendations.  Return in about 1 week (around 09/22/2018) for [facility] and NST/B[facility].  Future Appointments  Date Time Provider Department Center  09/15/2018  3:35 PM Allie Bossier, MD [facility]    Allie Bossier, MD

## 2018-09-15 NOTE — Telephone Encounter (Signed)
Preadmission screen Interpreter number [phone]

## 2018-09-15 NOTE — Progress Notes (Signed)

## 2018-09-16 NOTE — Progress Notes (Signed)
I have reviewed this chart and agree with the RN/CMA assessment and management.    Jennamarie Goings C Jaymeson Mengel, MD, FACOG Attending Physician, Faculty Practice [facility]  

## 2018-09-21 ENCOUNTER — Other Ambulatory Visit: Payer: Self-pay

## 2018-09-24 ENCOUNTER — Inpatient Hospital Stay (HOSPITAL_COMMUNITY)
Admission: RE | Admit: 2018-09-24 | Discharge: 2018-09-26 | DRG: 807 | Disposition: A | Discharge status: Home / Self Care | Payer: Medicaid Other | Attending: Obstetrics and Gynecology | Admitting: Obstetrics and Gynecology

## 2018-09-24 ENCOUNTER — Encounter (HOSPITAL_COMMUNITY): Payer: Self-pay

## 2018-09-24 ENCOUNTER — Inpatient Hospital Stay (HOSPITAL_COMMUNITY)
Admission: RE | Admit: 2018-09-24 | Discharge: 2018-09-24 | Disposition: A | Discharge status: Home / Self Care | Payer: Medicaid Other | Source: Ambulatory Visit | Attending: Obstetrics and Gynecology | Admitting: Obstetrics and Gynecology

## 2018-09-24 ENCOUNTER — Other Ambulatory Visit: Payer: Self-pay

## 2018-09-24 DIAGNOSIS — Z23 Encounter for immunization: Secondary | ICD-10-CM

## 2018-09-24 DIAGNOSIS — O09523 Supervision of elderly multigravida, third trimester: Secondary | ICD-10-CM | POA: Diagnosis present

## 2018-09-24 DIAGNOSIS — O24425 Gestational diabetes mellitus in childbirth, controlled by oral hypoglycemic drugs: Secondary | ICD-10-CM | POA: Diagnosis present

## 2018-09-24 DIAGNOSIS — Z3A39 39 weeks gestation of pregnancy: Secondary | ICD-10-CM | POA: Diagnosis not present

## 2018-09-24 DIAGNOSIS — O0990 Supervision of high risk pregnancy, unspecified, unspecified trimester: Secondary | ICD-10-CM

## 2018-09-24 DIAGNOSIS — O35EXX0 Maternal care for other (suspected) fetal abnormality and damage, fetal genitourinary anomalies, not applicable or unspecified: Secondary | ICD-10-CM | POA: Diagnosis present

## 2018-09-24 DIAGNOSIS — O358XX0 Maternal care for other (suspected) fetal abnormality and damage, not applicable or unspecified: Secondary | ICD-10-CM | POA: Diagnosis present

## 2018-09-24 DIAGNOSIS — Z789 Other specified health status: Secondary | ICD-10-CM | POA: Diagnosis present

## 2018-09-24 DIAGNOSIS — Z603 Acculturation difficulty: Secondary | ICD-10-CM | POA: Diagnosis present

## 2018-09-24 DIAGNOSIS — O24419 Gestational diabetes mellitus in pregnancy, unspecified control: Secondary | ICD-10-CM | POA: Diagnosis present

## 2018-09-24 LAB — CBC
HCT: 37.5 % (ref 36.0–46.0)
Hemoglobin: 12.2 g/dL (ref 12.0–15.0)
MCH: 30.8 pg (ref 26.0–34.0)
MCHC: 32.5 g/dL (ref 30.0–36.0)
MCV: 94.7 fL (ref 80.0–100.0)
PLATELETS: 137 10*3/uL — AB (ref 150–400)
RBC: 3.96 MIL/uL (ref 3.87–5.11)
RDW: 13.8 % (ref 11.5–15.5)
WBC: 7.7 10*3/uL (ref 4.0–10.5)
nRBC: 0 % (ref 0.0–0.2)

## 2018-09-24 LAB — GLUCOSE, CAPILLARY
Glucose-Capillary: 110 mg/dL — ABNORMAL HIGH (ref 70–99)
Glucose-Capillary: 82 mg/dL (ref 70–99)
Glucose-Capillary: 75 mg/dL (ref 70–99)
Glucose-Capillary: 120 mg/dL — ABNORMAL HIGH (ref 70–99)
Glucose-Capillary: 123 mg/dL — ABNORMAL HIGH (ref 70–99)

## 2018-09-24 LAB — RPR: RPR Ser Ql: NONREACTIVE

## 2018-09-24 LAB — TYPE AND SCREEN
ABO/RH(D): O POS
Antibody Screen: NEGATIVE

## 2018-09-24 LAB — ABO/RH: ABO/RH(D): O POS

## 2018-09-24 MED ORDER — ONDANSETRON HCL 4 MG/2ML IJ SOLN: 4.0000 mg | Freq: Four times a day (QID) | INTRAMUSCULAR | Status: DC | PRN

## 2018-09-24 MED ORDER — LIDOCAINE HCL (PF) 1 % IJ SOLN: 30.0000 mL | INTRAMUSCULAR | Status: DC | PRN

## 2018-09-24 MED ORDER — OXYCODONE-ACETAMINOPHEN 5-325 MG PO TABS: 1.0000 | ORAL_TABLET | ORAL | Status: DC | PRN

## 2018-09-24 MED ORDER — TETANUS-DIPHTH-ACELL PERTUSSIS 5-2.5-18.5 LF-MCG/0.5 IM SUSP: 0.5000 mL | Freq: Once | INTRAMUSCULAR | Status: DC

## 2018-09-24 MED ORDER — PRENATAL MULTIVITAMIN CH
1.0000 | ORAL_TABLET | Freq: Every day | ORAL | Status: DC
  Administered 2018-09-25 – 2018-09-26 (×2): 1 via ORAL
  Filled 2018-09-24 (×2): qty 1

## 2018-09-24 MED ORDER — ONDANSETRON HCL 4 MG/2ML IJ SOLN: 4.0000 mg | INTRAMUSCULAR | Status: DC | PRN

## 2018-09-24 MED ORDER — OXYTOCIN 40 UNITS IN NORMAL SALINE INFUSION - SIMPLE MED
1.0000 m[IU]/min | INTRAVENOUS | Status: DC
  Administered 2018-09-24: 2 m[IU]/min via INTRAVENOUS
  Filled 2018-09-24: qty 1000

## 2018-09-24 MED ORDER — DIPHENHYDRAMINE HCL 25 MG PO CAPS: 25.0000 mg | ORAL_CAPSULE | Freq: Four times a day (QID) | ORAL | Status: DC | PRN

## 2018-09-24 MED ORDER — ACETAMINOPHEN 325 MG PO TABS
650.0000 mg | ORAL_TABLET | ORAL | Status: DC | PRN
  Administered 2018-09-24: 650 mg via ORAL
  Filled 2018-09-24: qty 2

## 2018-09-24 MED ORDER — TERBUTALINE SULFATE 1 MG/ML IJ SOLN: 0.2500 mg | Freq: Once | INTRAMUSCULAR | Status: DC | PRN

## 2018-09-24 MED ORDER — FENTANYL CITRATE (PF) 100 MCG/2ML IJ SOLN
100.0000 ug | INTRAMUSCULAR | Status: DC | PRN
  Administered 2018-09-24: 100 ug via INTRAVENOUS

## 2018-09-24 MED ORDER — SOD CITRATE-CITRIC ACID 500-334 MG/5ML PO SOLN: 30.0000 mL | ORAL | Status: DC | PRN

## 2018-09-24 MED ORDER — ZOLPIDEM TARTRATE 5 MG PO TABS: 5.0000 mg | ORAL_TABLET | Freq: Every evening | ORAL | Status: DC | PRN

## 2018-09-24 MED ORDER — SENNOSIDES-DOCUSATE SODIUM 8.6-50 MG PO TABS
2.0000 | ORAL_TABLET | ORAL | Status: DC
  Administered 2018-09-25 (×2): 2 via ORAL
  Filled 2018-09-24 (×2): qty 2

## 2018-09-24 MED ORDER — WITCH HAZEL-GLYCERIN EX PADS: 1.0000 "application " | MEDICATED_PAD | CUTANEOUS | Status: DC | PRN

## 2018-09-24 MED ORDER — DIBUCAINE 1 % RE OINT: 1.0000 "application " | TOPICAL_OINTMENT | RECTAL | Status: DC | PRN

## 2018-09-24 MED ORDER — LACTATED RINGERS IV SOLN: 500.0000 mL | INTRAVENOUS | Status: DC | PRN

## 2018-09-24 MED ORDER — FENTANYL CITRATE (PF) 100 MCG/2ML IJ SOLN
INTRAMUSCULAR | Status: AC
  Administered 2018-09-24: 100 ug via INTRAVENOUS
  Filled 2018-09-24: qty 2

## 2018-09-24 MED ORDER — OXYTOCIN 40 UNITS IN NORMAL SALINE INFUSION - SIMPLE MED: 2.5000 [IU]/h | INTRAVENOUS | Status: DC

## 2018-09-24 MED ORDER — COCONUT OIL OIL: 1.0000 "application " | TOPICAL_OIL | Status: DC | PRN

## 2018-09-24 MED ORDER — ACETAMINOPHEN 325 MG PO TABS: 650.0000 mg | ORAL_TABLET | ORAL | Status: DC | PRN

## 2018-09-24 MED ORDER — MISOPROSTOL 25 MCG QUARTER TABLET
25.0000 ug | ORAL_TABLET | ORAL | Status: DC | PRN
  Administered 2018-09-24 (×3): 25 ug via VAGINAL
  Filled 2018-09-24 (×3): qty 1

## 2018-09-24 MED ORDER — OXYTOCIN BOLUS FROM INFUSION: 500.0000 mL | Freq: Once | INTRAVENOUS | Status: DC

## 2018-09-24 MED ORDER — LACTATED RINGERS IV SOLN
INTRAVENOUS | Status: DC
  Administered 2018-09-24 (×2): via INTRAVENOUS

## 2018-09-24 MED ORDER — ONDANSETRON HCL 4 MG PO TABS: 4.0000 mg | ORAL_TABLET | ORAL | Status: DC | PRN

## 2018-09-24 MED ORDER — SIMETHICONE 80 MG PO CHEW: 80.0000 mg | CHEWABLE_TABLET | ORAL | Status: DC | PRN

## 2018-09-24 MED ORDER — IBUPROFEN 600 MG PO TABS
600.0000 mg | ORAL_TABLET | Freq: Four times a day (QID) | ORAL | Status: DC
  Administered 2018-09-25 – 2018-09-26 (×7): 600 mg via ORAL
  Filled 2018-09-24 (×7): qty 1

## 2018-09-24 MED ORDER — OXYCODONE-ACETAMINOPHEN 5-325 MG PO TABS: 2.0000 | ORAL_TABLET | ORAL | Status: DC | PRN

## 2018-09-24 MED ORDER — BENZOCAINE-MENTHOL 20-0.5 % EX AERO: 1.0000 "application " | INHALATION_SPRAY | CUTANEOUS | Status: DC | PRN

## 2018-09-24 NOTE — Anesthesia Pain Management Evaluation Note (Signed)
  CRNA Pain Management Visit Note  Patient: Meagan Grant, 38 y.o., female  "Hello I am a member of the anesthesia team at [facility]. We have an anesthesia team available at all times to provide care throughout the hospital, including epidural management and anesthesia for C-section. I don't know your plan for the delivery whether it a natural birth, water birth, IV sedation, nitrous supplementation, doula or epidural, but we want to meet your pain goals."   1.Was your pain managed to your expectations on prior hospitalizations?   No prior hospitalizations  2.What is your expectation for pain management during this hospitalization?     Epidural  3.How can we help you reach that goal?   Record the patient's initial score and the patient's pain goal.   Pain: 5  Pain Goal: 8 [facility] wants you to be able to say your pain was always managed very well.  Laban Emperor 09/24/2018

## 2018-09-24 NOTE — Progress Notes (Signed)
OB/GYN Faculty Practice: Labor Progress Note  Subjective: Doing well, feeling a little bit of cramping but still able to sleep some.   Objective: BP (!) 95/55   Pulse (!) 55   Temp 98.6 F (37 C) (Oral)   Resp 18   Ht 5' 2.5" (1.588 m)   Wt 102.1 kg   LMP 12/25/2017 (Exact Date)   BMI 40.50 kg/m  Gen: well-appearing, NAD Dilation: Fingertip Effacement (%): 50 Station: -3 Presentation: Vertex Exam by:: Hermenia Bers, RN  Assessment and Plan: 38 y.o. [id number] [redacted]w[redacted]d here with IOL for A2GDM.   Labor: Induction started with cytotec, will give 2nd dose, progressed to 1cm. Attempted FB but cervix soft, external os 3cm and FB kept popping out. Discussed trying again with next check.  -- pain control: well-controlled at this time -- PPH Risk: low  Fetal Well-Being: EFW 3951g, >90%, AC> 95%. Cephalic by sutures.  -- Category I - continuous fetal monitoring  -- GBS negative   A2GDM: BG initially 110>82. -- continue to check q4hrs   Cristal Deer. Earlene Plater, DO OB/GYN Fellow, Faculty Practice  5:53 AM

## 2018-09-24 NOTE — H&P (Signed)
OBSTETRIC ADMISSION HISTORY AND PHYSICAL  Meagan Grant is a 38 y.o. female [phone] with IUP at [redacted]w[redacted]d by L/14 presenting for induction of labor for A2GDM. Has never had an induction before this pregnancy.   Reports fetal movement. Denies vaginal bleeding, leakage of fluids. Reports occasional contractions but not too painful.   She received her prenatal care at [facility].  Support person in labor: husband/partner   Ultrasounds . 8w0: viability U/S . 14w4: 1st trimester screen with normal nasal bone, dating changed based on this scan . 18w2: EFW 255g (55%), appropriate fetal growth, normal anatomy U/S, anterior placenta . 22w2: EFW 564g, 61% . 30w6: EFW 2194g, 87%, left hydronephrosis ( ) . 34w6: 3264g, >90%, AC> 85%, normal AFI and BPP, persistent L urinary tract dilation . 37w5: EFW 3951g, >90%, AC> 95%, persistent L urinary tract dilation 3mm  Prenatal History/Complications:  A2GDM: on metformin 1000mg  BID  history of prior infant weighing 4300g  History of ITP in pregnancy: platelets 151 about 3 weeks ago  Fetal pyelectasis: L urinary tract dilation - 28mm at 37w5  Language barrier: spanish   AMA  Past Medical History: Past Medical History:  Diagnosis Date  . Gestational diabetes     Past Surgical History: Past Surgical History:  Procedure Laterality Date  . DILATION AND CURETTAGE OF UTERUS      Obstetrical History: OB History    Gravida  5   Para  3   Term  3   Preterm      AB  1   Living  3     SAB  1   TAB      Ectopic      Multiple      Live Births  3           Social History: Social History   Socioeconomic History  . Marital status: Single    Spouse name: Not on file  . Number of children: Not on file  . Years of education: Not on file  . Highest education level: Not on file  Occupational History  . Not on file  Social Needs  . Financial resource strain: Not on file  . Food insecurity:    Worry: Never true     Inability: Never true  . Transportation needs:    Medical: No    Non-medical: No  Tobacco Use  . Smoking status: Never Smoker  . Smokeless tobacco: Never Used  Substance and Sexual Activity  . Alcohol use: No    Frequency: Never  . Drug use: No  . Sexual activity: Yes  Lifestyle  . Physical activity:    Days per week: Not on file    Minutes per session: Not on file  . Stress: Not on file  Relationships  . Social connections:    Talks on phone: Not on file    Gets together: Not on file    Attends religious service: Not on file    Active member of club or organization: Not on file    Attends meetings of clubs or organizations: Not on file    Relationship status: Not on file  Other Topics Concern  . Not on file  Social History Narrative  . Not on file    Family History: History reviewed. No pertinent family history.  Allergies: No Known Allergies  Medications Prior to Admission  Medication Sig Dispense Refill Last Dose  . metFORMIN (GLUCOPHAGE) 1000 MG tablet Take 1 tablet (1,000 mg total) by mouth at bedtime.  30 tablet 1 Taking  . Prenatal Vit-Fe Fumarate-FA (PRENATAL VITAMIN PO) Take by mouth.   Taking     Review of Systems  All systems reviewed and negative except as stated in HPI  Blood pressure 115/71, pulse 72, temperature 98.6 F (37 C), temperature source Oral, resp. rate 18, last menstrual period 12/25/2017, unknown if currently breastfeeding. General appearance: alert, well-appearing, NAD  Lungs: no respiratory distress Heart: regular rate  Abdomen: soft, non-tender; gravid  Pelvic: deferred Extremities: no LE edema Presentation: cephalic per RN check Fetal monitoring: 140-150s/mod/+a/-d Uterine activity: irregular Dilation: Fingertip Effacement (%): 50 Station: -3 Exam by:: Hermenia Bers, RN  Prenatal labs: (see care everywhere) ABO, Rh:  O POS Antibody:  Negative Rubella:  Immune RPR:   Negative HBsAg:   Negative HIV: Non Reactive (07/26  0750)  GBS:   Negative Glucola: A2GDM Genetic screening:  Trisomy 21 (negative, CF/SMA, Fragile X (negative, additional sequencing negative for hearing loss history   Prenatal Transfer Tool  Maternal Diabetes: Yes:  Diabetes Type:  Insulin/Medication controlled Genetic Screening: Normal Maternal Ultrasounds/Referrals: see above (L renal pyelectasis) Fetal Ultrasounds or other Referrals:  None Maternal Substance Abuse:  No Significant Maternal Medications: aspirin, PNV, metformin Significant Maternal Lab Results: None  Results for orders placed or performed during the hospital encounter of 09/24/18 (from the past 24 hour(s))  Glucose, capillary   Collection Time: 09/24/18  1:11 AM  Result Value Ref Range   Glucose-Capillary 110 (H) 70 - 99 mg/dL    Patient Active Problem List   Diagnosis Date Noted  . Supervision of high risk pregnancy, antepartum 08/03/2018  . AMA (advanced maternal age) multigravida 35+, third trimester 08/03/2018  . GDM (gestational diabetes mellitus) 08/03/2018  . Language barrier 08/03/2018  . Encounter for repeat ultrasound of fetal pyelectasis in singleton pregnancy, antepartum 08/03/2018  . History of ITP 02/18/2018    Assessment/Plan:  Meagan Grant is a 38 y.o. [id number] at [redacted]w[redacted]d here for IOL for A2GDM.   Labor: Induction to start with cytotec. Will attempt FB placement with next check.  -- pain control: open to options  Fetal Wellbeing: EFW 3951g, >90%, AC> 95% by Leopold's. Cephalic by sutures on RN check.  -- GBS (negative) -- continuous fetal monitoring - category I   A2GDM: Metformin 1000mg  BID.  -- monitor BG every 4 hours in latent/early labor, then every 2 hours in active labor   Postpartum Planning -- breast/unsure -- RI/[x] Tdap/[x] flu  Selia Wareing S. Earlene Plater, DO OB/GYN Fellow

## 2018-09-24 NOTE — Discharge Summary (Signed)
Postpartum Discharge Summary     Patient Name: Meagan Grant DOB: June 21, 1981 MRN: [id number]  Date of admission: 09/24/2018 Delivering Provider: Brand Males   Date of discharge: 09/26/2018  Admitting diagnosis: preadmission  Intrauterine pregnancy: [redacted]w[redacted]d     Secondary diagnosis:  Active Problems:   AMA (advanced maternal age) multigravida 35+, third trimester   GDM (gestational diabetes mellitus)   Language barrier   Encounter for repeat ultrasound of fetal pyelectasis in singleton pregnancy, antepartum  Additional problems: None     Discharge diagnosis: Term Pregnancy Delivered and GDM A2                                                                                                Post partum procedures:None  Augmentation: Pitocin and Cytotec  Complications: None  Hospital course:  Induction of Labor With Vaginal Delivery   38 y.o. yo [id number] at [redacted]w[redacted]d was admitted to the hospital 09/24/2018 for induction of labor.  Indication for induction: A2 DM.  Patient had an uncomplicated labor course as follows: Membrane Rupture Time/Date: 6:38 PM ,09/24/2018   Intrapartum Procedures: Episiotomy: None [1]                                         Lacerations:  None [1]  Patient had delivery of a Viable infant.  Information for the patient's newborn:  Hally, Deutmeyer [[id number]]  Delivery Method: Vaginal, Spontaneous(Filed from Delivery Summary)   09/24/2018  Details of delivery can be found in separate delivery note.  Patient had a routine postpartum course. Patient is discharged home 09/26/18.  Magnesium Sulfate recieved: No BMZ received: No  Physical exam  Vitals:   09/25/18 0912 09/25/18 1347 09/25/18 2136 09/26/18 0522  BP: 107/62 109/60 (!) 99/55 116/78  Pulse: 66 65 65 69  Resp: 18 18 16 16   Temp: 98.9 F (37.2 C) 97.7 F (36.5 C) 97.8 F (36.6 C) 98.1 F (36.7 C)  TempSrc: Oral Oral Oral Oral  SpO2:  100% 100% 99%  Weight:      Height:        General: alert, cooperative and no distress Lochia: appropriate Uterine Fundus: firm Incision: N/A DVT Evaluation: No evidence of DVT seen on physical exam. No significant calf/ankle edema. Labs: Lab Results  Component Value Date   WBC 7.7 09/24/2018   HGB 12.2 09/24/2018   HCT 37.5 09/24/2018   MCV 94.7 09/24/2018   PLT 137 (L) 09/24/2018   No flowsheet data found.  Discharge instruction: per After Visit Summary and "Baby and Me Booklet".  After visit meds:  Allergies as of 09/26/2018   No Known Allergies     Medication List    STOP taking these medications   metFORMIN 1000 MG tablet Commonly known as:  GLUCOPHAGE     TAKE these medications   PRENATAL VITAMIN PO Take by mouth.       Diet: routine diet  Activity: Advance as tolerated. Pelvic rest for 6 weeks.   Outpatient follow  up:6 weeks Follow up Appt:No future appointments. Follow up Visit: Follow-up Information    [facility].   Why:  Mom is calling Contact information: Fax [phone]       Center for [facility]. Schedule an appointment as soon as possible for a visit.   Specialty:  Obstetrics and Gynecology Why:  Make appointment to be seen for postpartum appointment  Contact information: [address] [place] [address] [phone]          Please schedule this patient for Postpartum visit in: 6 weeks with the following provider: Any provider For C/S patients schedule nurse incision check in weeks 2 weeks: no High risk pregnancy complicated by: GDM Delivery mode:  SVD Anticipated Birth Control:  IUD PP Procedures needed: 2 hour GTT  Schedule Integrated BH visit: no  Newborn Data: Live born female  Birth Weight:   APGAR: ,   Newborn Delivery   Birth date/time:  09/24/2018 18:40:00 Delivery type:  Vaginal, Spontaneous     Baby Feeding: Bottle and Breast Disposition:home with mother   Sharyon Cable, CNM  09/26/18  11:21 AM

## 2018-09-24 NOTE — Progress Notes (Signed)
Subjective: Pt is doing well. Reports feeling some cramping and mild contractions every 4 mins. Pt is able to rest in between contractions.   Objective: BP 119/72   Pulse (!) 52   Temp 98.2 F (36.8 C) (Oral)   Resp 18   Ht 5' 2.5" (1.588 m)   Wt 102.1 kg   LMP 12/25/2017 (Exact Date)   BMI 40.50 kg/m  Gen: well appearing, not in distress Dilation: 1 Effacement (%): 50 Station: -3 Presentation: Vertex Exam by:: Dr. Marlis Edelson  Assessment and Plan: 38 y.o. [id number] [redacted]w[redacted]d IOL for A2GDM. Pt is contracting every 2-4 mins. Will recheck and consider starting pit.   Labor:  -- pain control: none -- PPH Risk: medium   Fetal Well-Being: EFW 3951g at 37.5 weeks.  -- Category 1; FHR 135, +accels, no decels -- GBS negative  Camelia Eng, SNM 9:23 AM

## 2018-09-24 NOTE — Plan of Care (Signed)

## 2018-09-24 NOTE — Progress Notes (Signed)
Labor Progress Note  Subjective: Pt reporting frequent contractions and asking for cervix to be examined. Pt breathing well through contractions after receiving IV pain meds.   Objective: BP (!) 142/81   Pulse (!) 58   Temp 98.8 F (37.1 C) (Oral)   Resp 20   Ht 5' 2.5" (1.588 m)   Wt 102.1 kg   LMP 12/25/2017 (Exact Date)   BMI 40.50 kg/m  Gen: well-appearing, mild distress Dilation: 5.5 Effacement (%): 90 Cervical Position: Middle Station: -2 Presentation: Vertex Exam by:: Penne Rosenstock, SNM  Assessment and Plan: 38 y.o. [id number] [redacted]w[redacted]d IOL for A2GDM. Pt on pit. Cervix 5.5/90/-1. Attempt to AROM, but unsuccessful at attempt.   Labor:  -- pain control: IV pain meds -- PPH Risk: medium  Fetal Well-Being:  --Cephalic by SVE -- Category 1; FHR 135, moderate variability, +accels, no decels   -- GBS negative  Camelia Eng, SNM 6:11 PM

## 2018-09-25 MED ORDER — INFLUENZA VAC SPLIT QUAD 0.5 ML IM SUSY
0.5000 mL | PREFILLED_SYRINGE | INTRAMUSCULAR | Status: AC
  Administered 2018-09-26: 0.5 mL via INTRAMUSCULAR
  Filled 2018-09-25: qty 0.5

## 2018-09-25 NOTE — Lactation Note (Signed)
This note was copied from a baby's chart. Lactation Consultation Note  Patient Name: Meagan Grant OMVEH'M Date: 09/25/2018 Reason for consult: Initial assessment;Term P3, 5 hour female infant. [place] Interpreter used : Mardene Celeste   Per mom, she latched infant for 20 minutes prior to [facility] entering the room, LC did not observe a latch at this time.  Infant asleep in bed and not cuing at this time.  LC did breast assessment and notice mom has short shafted nipples, LC gave mom breast shells and explained how to use, mom knows to wear them in bra during the day and not sleep in them at night.  Per dad, infant had one stool since delivery. Mom knows to breastfeed infant according hunger cues, 8 or more times within 24 hours. Mom is experienced at breastfeeding she breastfeed her other children for one year. Mom is active on the [facility] program in [place]. Mom doesn't have a breast pump at home.  LC gave mom the harmony hand pump and explained how to clean, assemble, re-assemble and store pump parts. LC discussed I&O. Reviewed Baby & Me book's Breastfeeding Basics.  Mom made aware of O/P services, breastfeeding support groups, community resources, and our phone # for post-discharge questions.  Maternal Data Formula Feeding for Exclusion: No Has patient been taught Hand Expression?: Yes Does the patient have breastfeeding experience prior to this delivery?: Yes  Feeding Feeding Type: Breast Fed  LATCH Score                   Interventions Interventions: Breast feeding basics reviewed;Hand pump;Shells  Lactation Tools Discussed/Used Tools: Shells WIC Program: Yes Pump Review: Setup, frequency, and cleaning;Milk Storage Initiated by:: Danelle Earthly, IBCLC Date initiated:: 09/25/18   Consult Status Consult Status: Follow-up Date: 09/25/18 Follow-up type: In-patient    Danelle Earthly 09/25/2018, 12:29 AM

## 2018-09-25 NOTE — Progress Notes (Signed)
Post Partum Day 1 Subjective: No complaints, up ad lib, voiding, tolerating PO and + flatus Breast feeding. Baby doing well.  Patient is Spanish-speaking only, Spanish interpreter present for this encounter.  Objective: Blood pressure 107/62, pulse 66, temperature 98.9 F (37.2 C), temperature source Oral, resp. rate 18, height 5' 2.5" (1.588 m), weight 102.1 kg, last menstrual period 12/25/2017, unknown if currently breastfeeding.  Physical Exam:  General: alert and no distress Lochia: appropriate Uterine Fundus: firm DVT Evaluation: No evidence of DVT seen on physical exam. Negative Homan's sign. No cords or calf tenderness.  Recent Labs    09/24/18 0107  HGB 12.2  HCT 37.5    Assessment/Plan: Plan for discharge tomorrow, Breastfeeding and Contraception condoms   LOS: 1 day   Ugonna Anyanwu 09/25/2018, 9:46 AM

## 2018-09-25 NOTE — Lactation Note (Signed)
This note was copied from a baby's chart. Lactation Consultation Note  Patient Name: Meagan Grant IONGE'X Date: 09/25/2018   Follow up assessment.  [name] Meagan Grant now 20 hours old.  Infant getting hearing screen on arrival. Used interpreter on a stick, Meagan Grant [phone].  He started cuing.  Minimal assist with positioning and latch.  Infant latched and breastfed and fell asleep in five minutes.  Mom has english Understanding Mother and Baby Book.  Gave mom spanish book and showed her the hand expression demos.  Mom reports she has never hand expressed with breastfeeding.  Has breastfed all of her children until 1 year.  Plans to breastfed Meagan Grant until at least 1 year also.  Mom reports she will be staying home with him.  Mom reports it is okay to show her how to hand express.  Hand expressed with mom and small drops of colostrum easily expressed and he started rooting and latched back on well. Urged hand expressing prior to latching and past breastfeeding and rubbing expressed breastmilk on nipples and air dry.Left mom and baby breastfeeding.  Urged mom to call lactation as needed.  Maternal Data    Feeding Feeding Type: Breast Fed  LATCH Score Latch: Grasps breast easily, tongue down, lips flanged, rhythmical sucking.  Audible Swallowing: Spontaneous and intermittent  Type of Nipple: Everted at rest and after stimulation  Comfort (Breast/Nipple): Soft / non-tender  Hold (Positioning): No assistance needed to correctly position infant at breast.  LATCH Score: 10  Interventions Interventions: Breast feeding basics reviewed  Lactation Tools Discussed/Used     Consult Status      Neomia Dear 09/25/2018, 3:06 PM

## 2018-09-26 DIAGNOSIS — Z3A39 39 weeks gestation of pregnancy: Secondary | ICD-10-CM

## 2018-09-26 DIAGNOSIS — O24425 Gestational diabetes mellitus in childbirth, controlled by oral hypoglycemic drugs: Secondary | ICD-10-CM

## 2018-09-26 NOTE — Lactation Note (Signed)
This note was copied from a baby's chart. Lactation Consultation Note  Patient Name: Meagan Grant WNIOE'V Date: 09/26/2018 Reason for consult: Follow-up assessment\  Mother sitting on sofa with infant in cradle hold.  Observed poor positioning. Infant released the breast and observed that infant was only on the top half of the nipple . Assist mother with re-latching infant in with better depth and getting entire nipple in infants mouth with a wide gape.   Encouraged mother to use good breast support and pillow support.  Mother taught to do breast compression.  Observed infant with good milk transferfor 15 mins.   Mother reports that she is not having any nipple discomfort.   Mother has harmony pump provided by hosp staff.  Discussed treatment and prevention of engorgement.   Advised mother to cue base feed infant and feed infant 8-12 times in 214 hours and to allow for infant to cluster feed. Encouraged frequent STS.  Mother is aware of available LC services at [facility]. She is active with WIC. Mother to follow up with [facility] services for breastfeeding questions or concerns.    Maternal Data    Feeding Feeding Type: Breast Fed  LATCH Score Latch: Grasps breast easily, tongue down, lips flanged, rhythmical sucking.  Audible Swallowing: Spontaneous and intermittent  Type of Nipple: Everted at rest and after stimulation  Comfort (Breast/Nipple): Soft / non-tender  Hold (Positioning): Assistance needed to correctly position infant at breast and maintain latch.  LATCH Score: 9  Interventions Interventions: Assisted with latch;Breast massage;Hand express;Adjust position;Support pillows;Position options;Expressed milk;Hand pump  Lactation Tools Discussed/Used     Consult Status Consult Status: Complete    Michel Bickers 09/26/2018, 12:37 PM

## 2018-10-17 ENCOUNTER — Encounter: Payer: Self-pay | Admitting: *Deleted

## 2018-11-08 ENCOUNTER — Other Ambulatory Visit: Payer: Self-pay

## 2018-11-08 ENCOUNTER — Ambulatory Visit (INDEPENDENT_AMBULATORY_CARE_PROVIDER_SITE_OTHER): Payer: Self-pay | Admitting: Student

## 2018-11-08 DIAGNOSIS — Z1389 Encounter for screening for other disorder: Secondary | ICD-10-CM

## 2018-11-08 DIAGNOSIS — O24439 Gestational diabetes mellitus in the puerperium, unspecified control: Secondary | ICD-10-CM

## 2018-11-08 NOTE — Progress Notes (Signed)
Subjective:     Meagan Grant is a 38 y.o. female who presents for a postpartum visit. She is 6 weeks postpartum following a spontaneous vaginal delivery. I have fully reviewed the prenatal and intrapartum course. The delivery was at 39 gestational weeks. Outcome: spontaneous vaginal delivery. Anesthesia: IV sedation. Postpartum course has been unremarkable. Baby's course has been unremarkable. Baby is feeding by breast. Bleeding no bleeding. Bowel function is normal. Bladder function is normal. Patient is not sexually active. Contraception method is IUD. Postpartum depression screening: :"negative"}.  The following portions of the patient's history were reviewed and updated as appropriate: allergies, current medications, past family history, past medical history, past social history, past surgical history and problem list.  Review of Systems Pertinent items are noted in HPI.   Objective:    BP 120/78   Pulse (!) 55   Wt 216 lb (98 kg)   BMI 38.88 kg/m   General:  alert, cooperative and no distress   Breasts:  inspection negative, no nipple discharge or bleeding, no masses or nodularity palpable  Lungs: clear to auscultation bilaterally  Heart:  regular rate and rhythm, S1, S2 normal, no murmur, click, rub or gallop  Abdomen: soft, non-tender; bowel sounds normal; no masses,  no organomegaly   Vulva:  not evaluated  Vagina: not evaluated  Cervix:  not examined  Corpus: not examined  Adnexa:  not evaluated  Rectal Exam: Not performed.        Assessment:    Healthy  postpartum exam. Pap smear not done at today's visit.   Plan:    1. Contrace[facility]: [facility]; [facility].  2. Patient doing well; no complaints. Period has not returned. We will call her about her GTT results.  3. Follow up in: 1 year or as needed.

## 2018-11-08 NOTE — Patient Instructions (Signed)
Colocación de un dispositivo intrauterino  Intrauterine Device Insertion  Un dispositivo intrauterino (DIU) es un dispositivo médico que se coloca en el útero para impedir el embarazo. Es un dispositivo pequeño en forma de “T” del que cuelgan uno o dos hilos de nailon. Los hilos cuelgan de la parte inferior del útero (cuello uterino) para que el DIU pueda extraerse en el futuro. Hay dos tipos de DIU:  · DIU de cobre. Este tipo de DIU está envuelto por un alambre de cobre. El cobre hace que el útero y las trompas de Falopio produzcan un líquido que destruye los espermatozoides. Un DIU de cobre puede durar hasta 10 años.  · DIU hormonal. Este tipo de DIU está hecho de plástico y contiene la hormona progestina (progesterona sintética). Esta hormona hace que la mucosidad del cuello uterino se haga más espesa y evita que el esperma ingrese en el útero. También hace que el endometrio sea más delgado, lo que impide la implantación del óvulo fertilizado. La hormona debilita o destruye los espermatozoides que ingresan al útero. Un DIU hormonal puede durar entre 3 y 5 años.  Informe al médico acerca de lo siguiente:  · Cualquier alergia que tenga.  · Todos los medicamentos que utiliza, incluidos vitaminas, hierbas, gotas oftálmicas, cremas y medicamentos de venta libre.  · Cualquier problema que usted o sus familiares hayan tenido con anestésicos.  · Cualquier enfermedad de la sangre que tenga.  · Cirugías a las que se sometió.  · Todas las afecciones que tenga, incluidas las ITS (infecciones de transmisión sexual) que podría tener.  · Si está embarazada o podría estarlo.  ¿Cuáles son los riesgos?  En general, se trata de un procedimiento seguro. Sin embargo, pueden ocurrir complicaciones, por ejemplo:  · Infección.  · Hemorragia.  · Reacciones alérgicas a los medicamentos.  · Punción accidental (perforación) en el útero o daño a otras estructuras u otros órganos.  · La colocación accidental del DIU en la capa muscular del  útero (miometrio) o fuera del útero.  · Que el DIU se salga del útero (expulsión). Esto es más frecuente en las mujeres que han dado a luz recientemente.  · Un embarazo que ocurre en las trompas de Falopio (embarazo ectópico).  · Infección del útero y de las trompas de Falopio (enfermedad pélvica inflamatoria).  ¿Qué ocurre antes del procedimiento?  · Programe la colocación del DIU para cuando tenga el período menstrual o inmediatamente después para asegurarse de que no está embarazada. La colocación del DIU se tolera mejor poco después del ciclo menstrual.  · Siga las indicaciones del médico respecto de las restricciones de comidas o bebidas.  · Consulte a su médico si debe cambiar o suspender los medicamentos que toma habitualmente. Esto es muy importante si toma medicamentos para la diabetes o anticoagulantes.  · Quizás le indiquen que tome un analgésico antes del procedimiento.  · Es posible que deba hacerse estudios de lo siguiente:  ? Embarazo. Una prueba de embarazo implica recolectar una muestra de orina.  ? ITS. Colocar un DIU a una mujer que tiene una ITS puede empeorar la afección.  ? Cáncer de cuello uterino. Pueden realizarle una prueba de Papanicolaou para descartar este tipo de cáncer. Para ello, se toman células del cuello uterino, que luego se examinan con un microscopio.  · Es posible que le realicen un examen físico para determinar el tamaño y la posición del útero.  Este procedimiento puede variar según el médico y el hospital.  ¿Qué ocurre durante el   procedimiento?  · Le colocarán un instrumento (espéculo) en la vagina, y este se ensanchará para que el médico pueda ver el cuello uterino.  · Es posible que le coloquen medicamentos en el cuello uterino para disminuir el riesgo de contraer una infección (antiséptico).  · Tal vez le administren un anestésico para adormecer cada lado del cuello uterino (bloqueo intracervical o bloqueo paracervical). Este medicamento suele administrarse mediante una  inyección en el cuello uterino.  · Se introducirá un instrumento (sonda uterina) en el útero para determinar la longitud del útero y la dirección hacia la cual el útero podría estar inclinado.  · Se introducirá un instrumento o un tubo delgado (insertador de DIU) que sostiene el DIU en la vagina, a través del conducto endocervical, hasta el útero.  · El DIU se colocará en el útero, y el insertador de DIU se extraerá.  · Los hilos unidos al DIU se cortarán para que queden justo debajo del cuello uterino.  Este procedimiento puede variar según el médico y el hospital.  ¿Qué ocurre después del procedimiento?  · Podrá tener sangrado después del procedimiento. Esto es normal. Varía desde un goteo ligero (manchado) durante un par de días hasta un sangrado similar al de la menstruación.  · Puede sentir cólicos y dolor.  · Puede sentirse mareada o aturdida.  · Puede sentir dolor en la parte inferior de la espalda.  Resumen  · Un dispositivo intrauterino (DIU) es un dispositivo pequeño en forma de “T” del que cuelgan uno o dos hilos de nailon.  · Hay dos tipos de DIU. Pueden colocarle un DIU de cobre o un DIU hormonal.  · Programe la colocación del DIU para cuando tenga el período menstrual o inmediatamente después para asegurarse de que no está embarazada. La colocación del DIU se tolera mejor poco después del ciclo menstrual.  · Podrá tener sangrado después del procedimiento. Esto es normal. Varía desde un goteo ligero durante un par de días hasta un sangrado similar al menstrual.  Esta información no tiene como fin reemplazar el consejo del médico. Asegúrese de hacerle al médico cualquier pregunta que tenga.  Document Released: 04/06/2012 Document Revised: 04/15/2017 Document Reviewed: 04/15/2017  Elsevier Interactive Patient Education © 2019 Elsevier Inc.

## 2018-11-09 LAB — GLUCOSE TOLERANCE, 2 HOURS
Glucose, 2 hour: 98 mg/dL (ref 65–139)
Glucose, GTT - Fasting: 84 mg/dL (ref 65–99)

## 2018-11-15 ENCOUNTER — Telehealth: Payer: Self-pay | Admitting: Student

## 2018-11-15 NOTE — Telephone Encounter (Signed)
Called patient and left vmail that she passed her glucose tolerance test.   Meagan Grant

## 2018-12-15 IMAGING — US US OB TRANSVAGINAL
1 series · 15 of 28 positions shown · non-contrast
Comparison: None.

CLINICAL DATA: Vaginal bleeding.

EXAM:
OBSTETRIC <14 WK US AND TRANSVAGINAL OB US
TECHNIQUE: Both transabdominal and transvaginal ultrasound examinations were
performed for complete evaluation of the gestation as well as the
maternal uterus, adnexal regions, and pelvic cul-de-sac.
Transvaginal technique was performed to assess early pregnancy.

[Series 1: us ob transvaginal · 37 acquisitions, 15 frames shown]
[im 1/37]
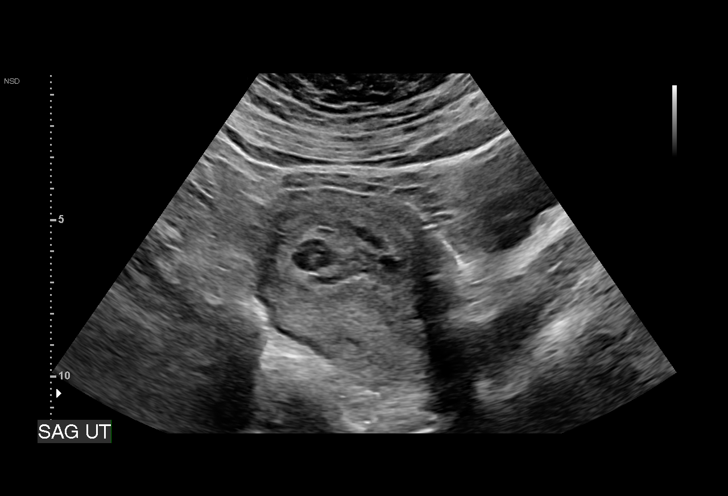
[im 3/37]
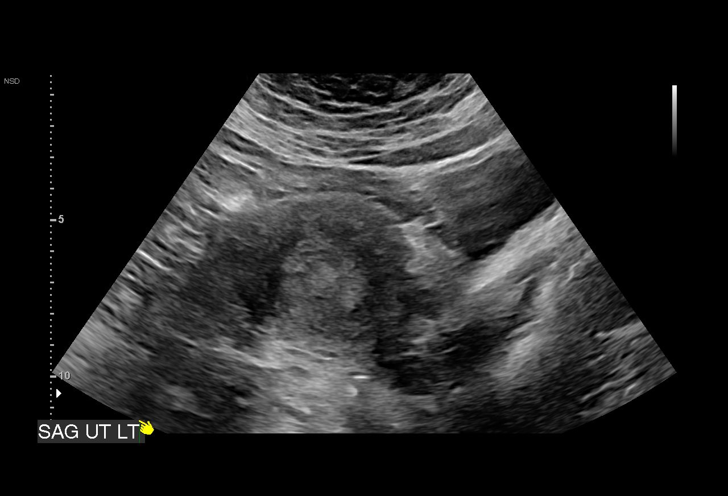
[im 6/37]
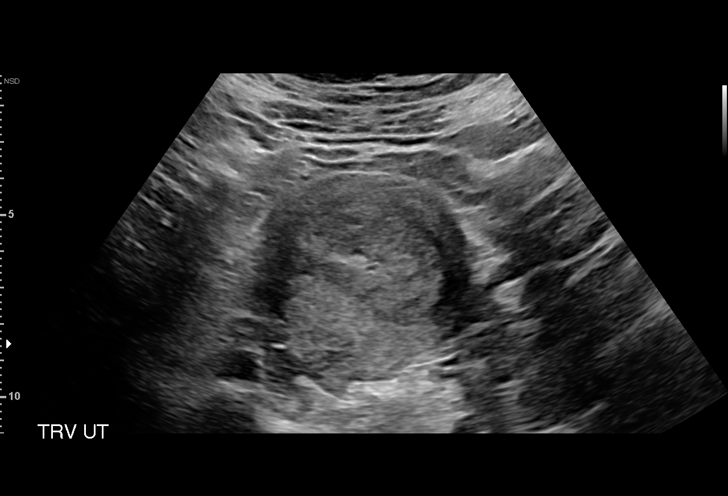
[im 9/37]
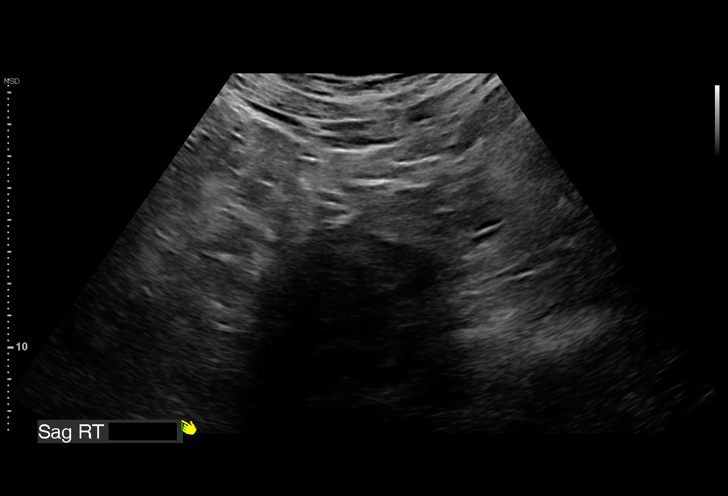
[im 11/37]
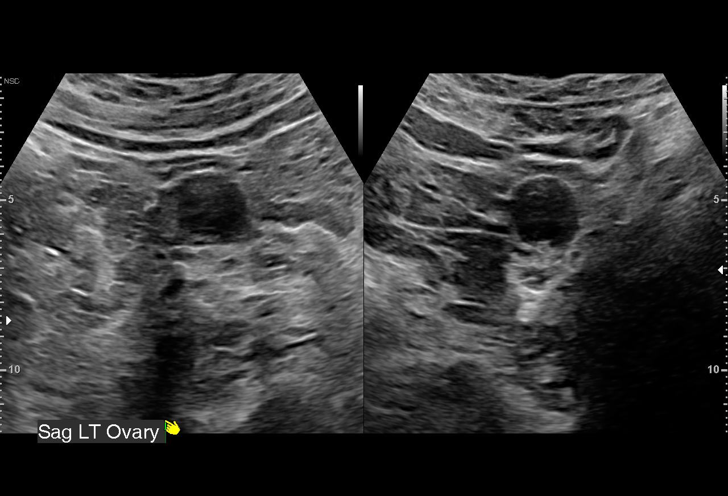
[im 14/37]
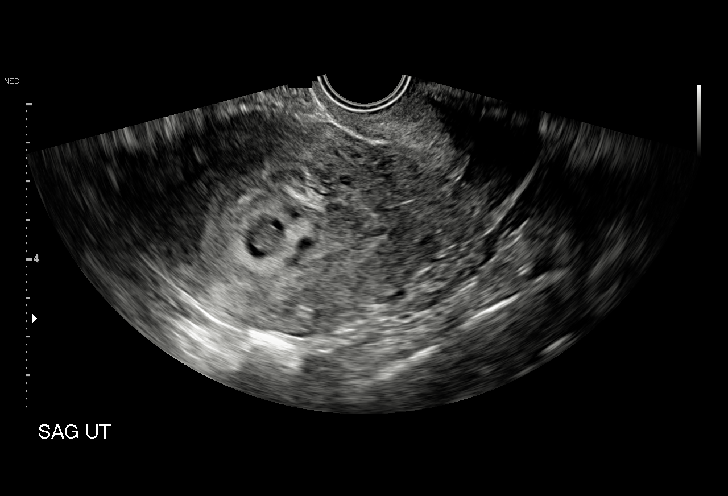
[im 17/37]
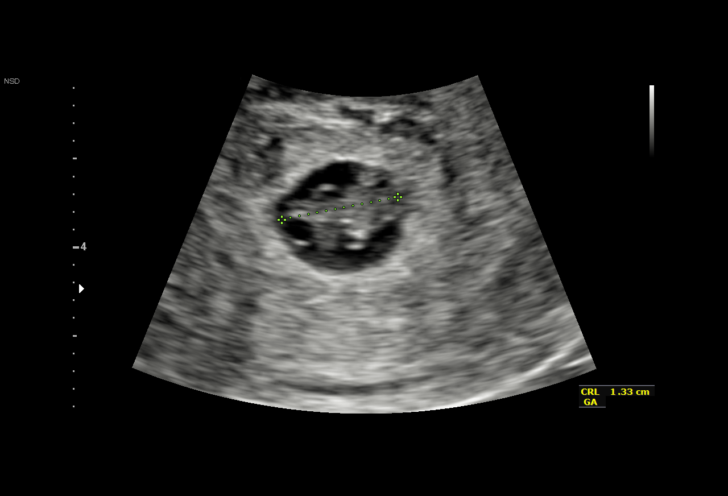
[im 19/37]
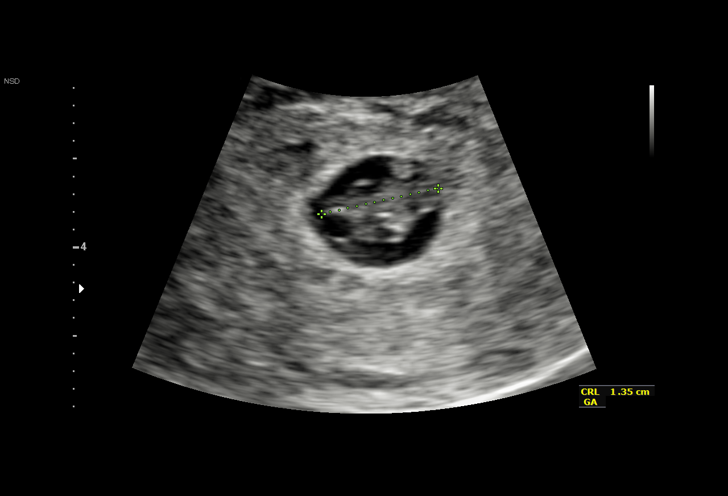
[im 21/37]
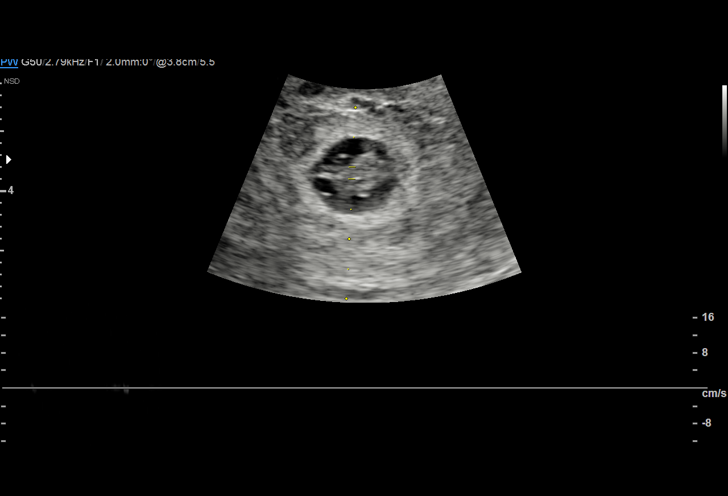
[im 23/37]
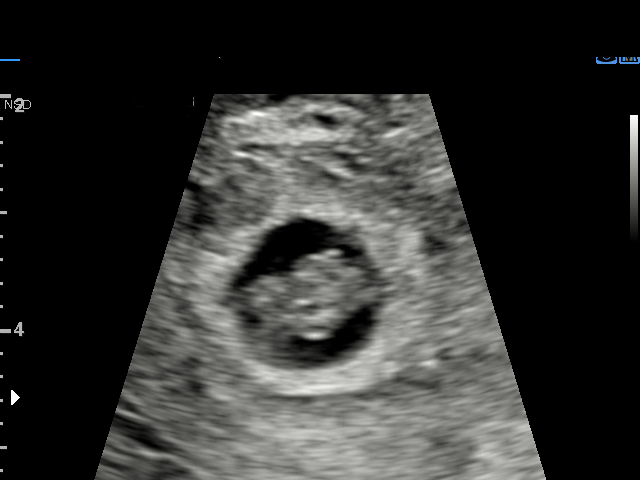
[im 26/37]
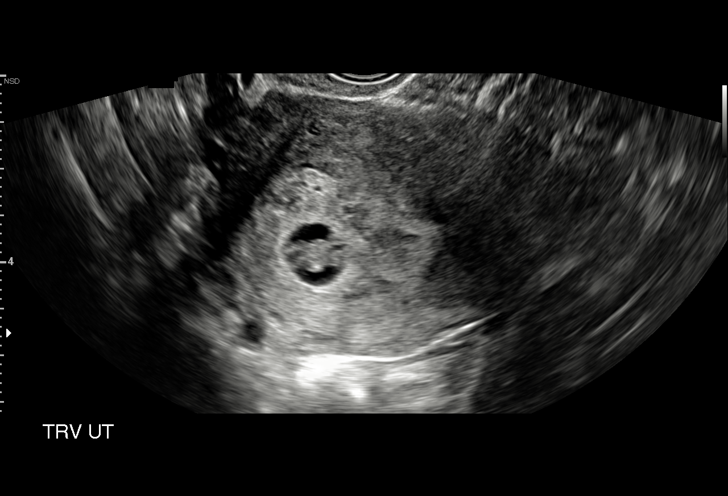
[im 29/37]
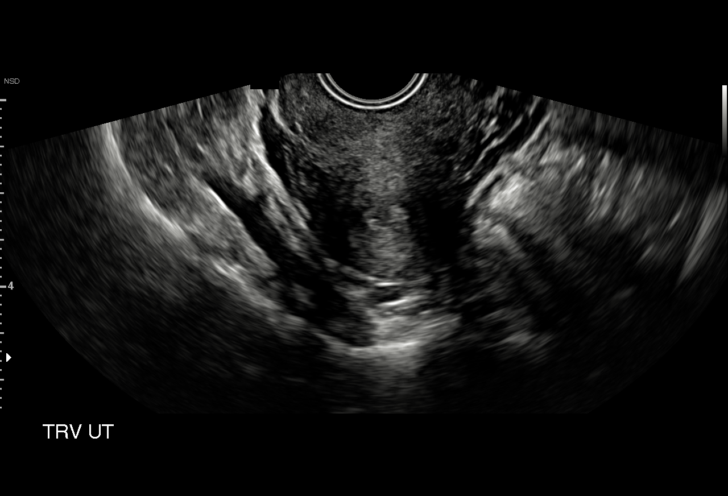
[im 31/37]
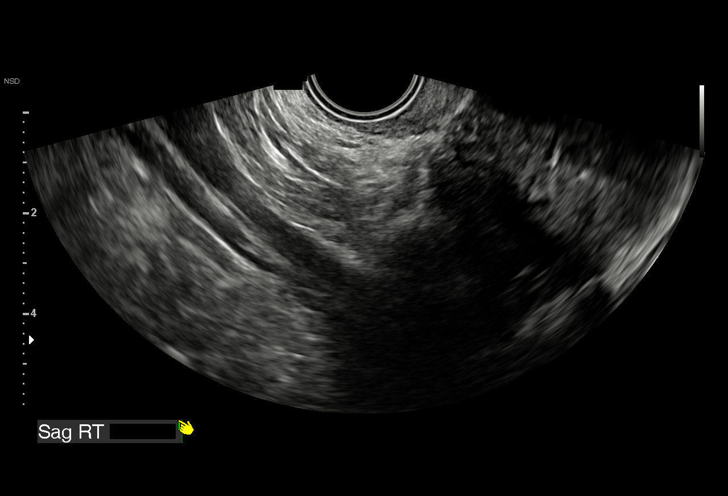
[im 34/37]
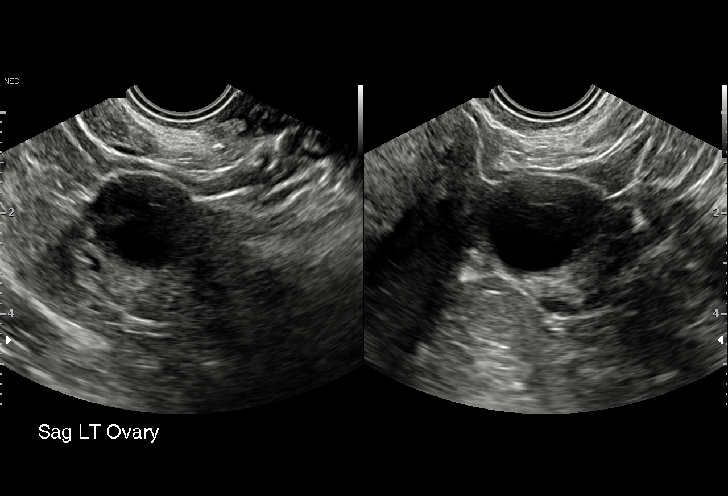
[im 37/37]
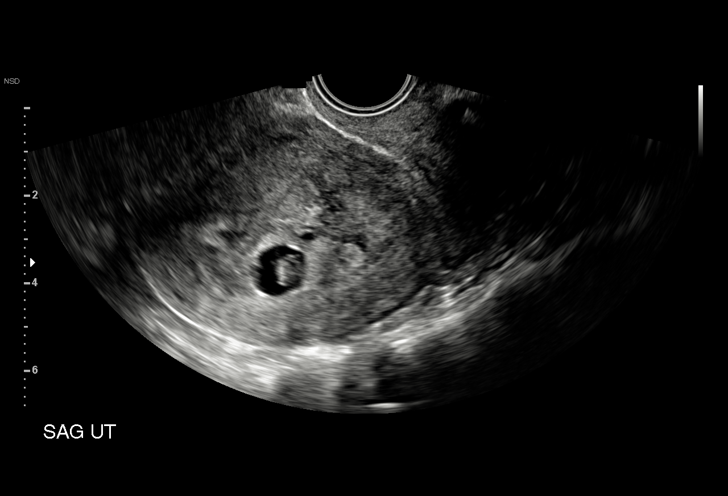

[15 of 28 positions shown; findings below may reference images not displayed]

FINDINGS: Intrauterine gestational sac: Single; small size noted

Yolk sac:  Visualized.

Embryo:  Visualized.

Cardiac Activity: Not Visualized.

CRL:  14 mm   7 w   4 d                  US EDC: 04/05/2018

Subchorionic hemorrhage:  None visualized.

Maternal uterus/adnexae: Small left ovarian corpus luteum cyst.
Nonvisualization of right ovary, however no adnexal mass or abnormal
free fluid identified.
IMPRESSION: Findings meet definitive criteria for failed pregnancy. This follows
SRU consensus guidelines: Diagnostic Criteria for Nonviable
Pregnancy Early in the First Trimester. N Engl J Med

## 2019-06-14 IMAGING — US US OB COMP LESS 14 WK
1 series · 15 of 28 positions shown · non-contrast
Comparison: None.

CLINICAL DATA: Vaginal bleeding. 1st trimester pregnancy of unknown
anatomic location.

EXAM:
OBSTETRIC <14 WK US AND TRANSVAGINAL OB US
TECHNIQUE: Both transabdominal and transvaginal ultrasound examinations were
performed for complete evaluation of the gestation as well as the
maternal uterus, adnexal regions, and pelvic cul-de-sac.
Transvaginal technique was performed to assess early pregnancy.

[Series 1: us ob comp less 14 wk · 15 of 75 slices shown]
[im 1/75]
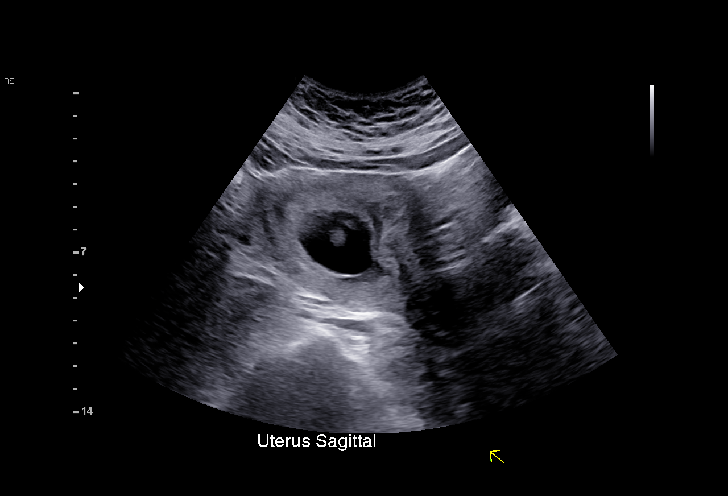
[im 6/75]
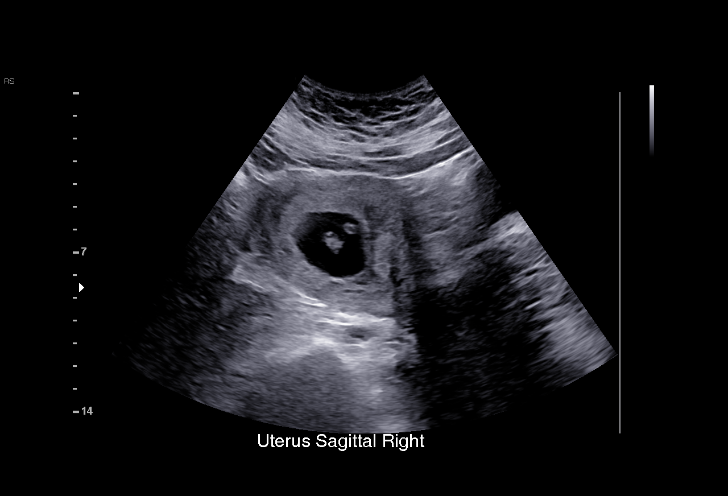
[im 11/75]
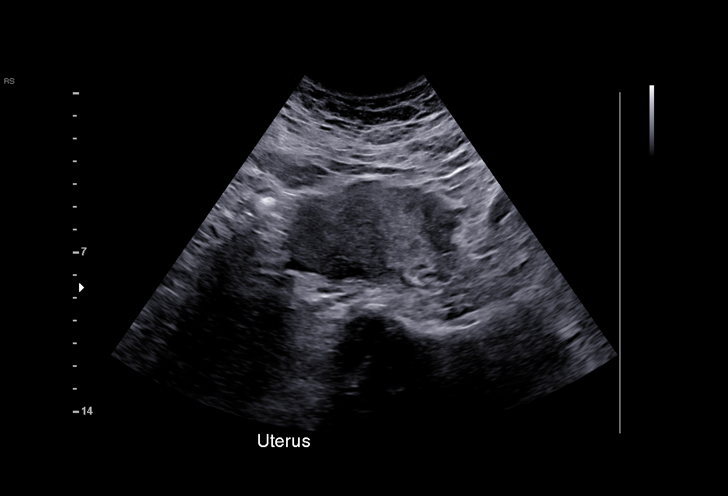
[im 17/75]
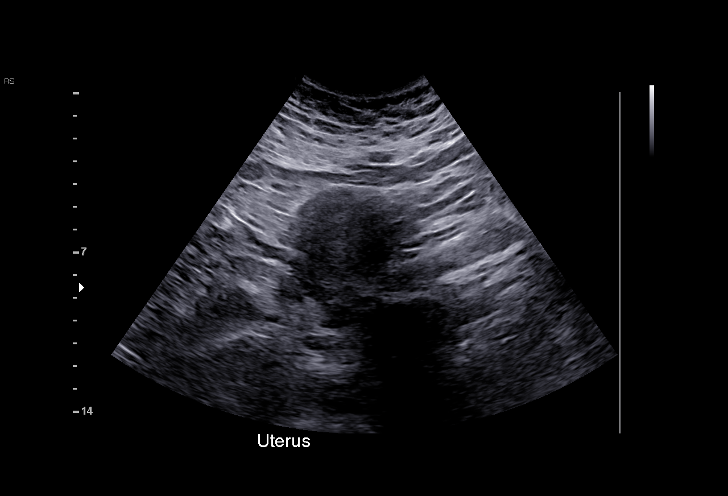
[im 22/75]
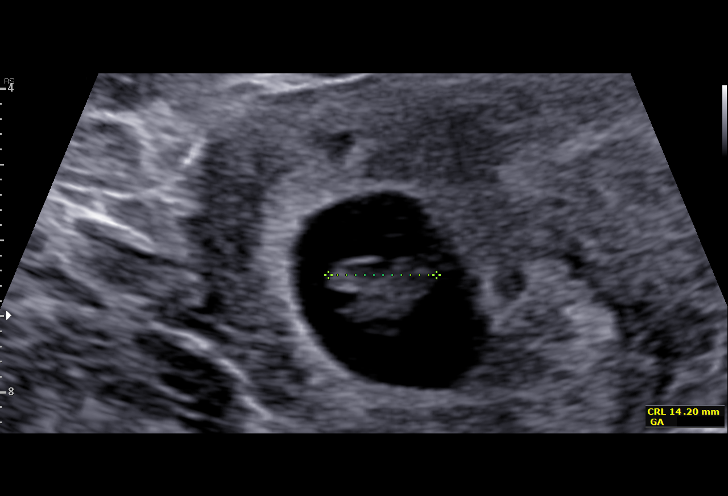
[im 28/75]
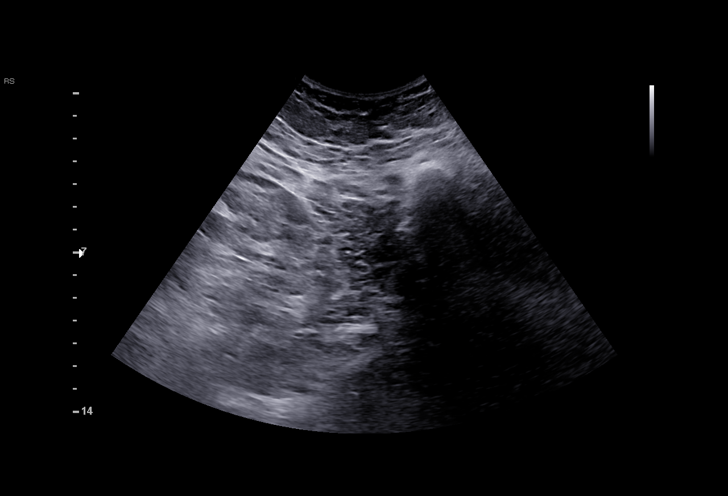
[im 33/75]
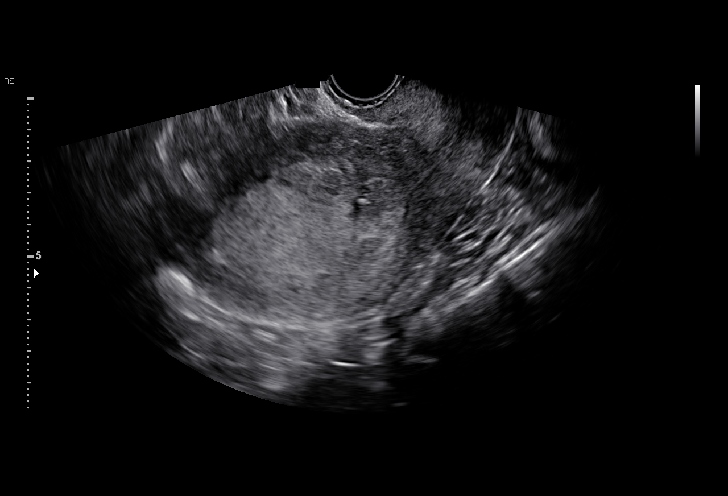
[im 39/75]
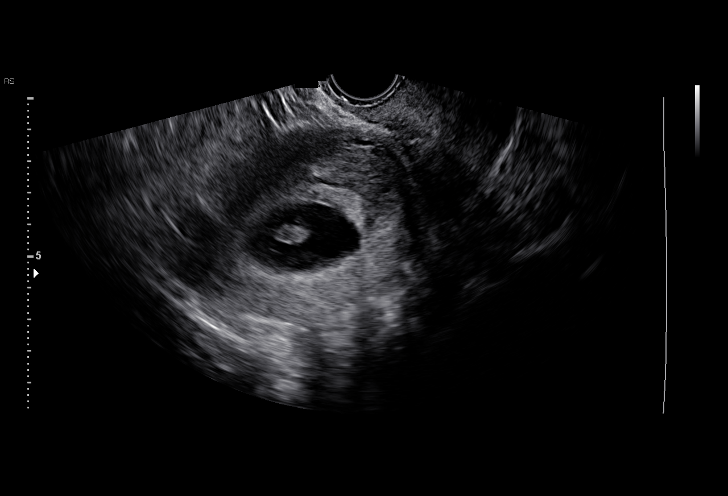
[im 42/75]
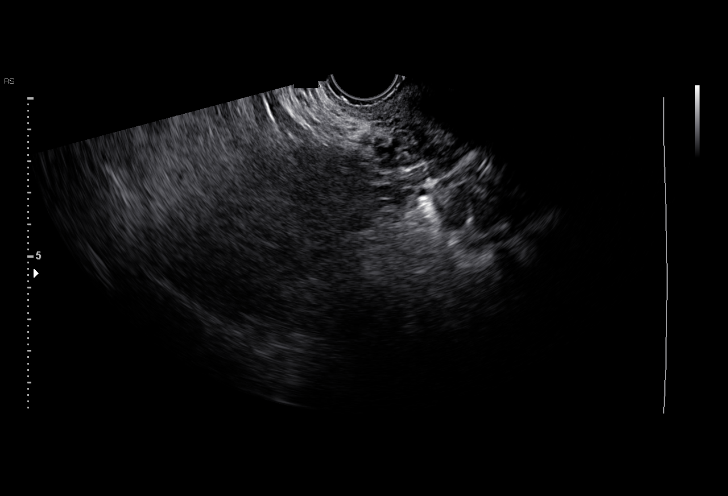
[im 47/75]
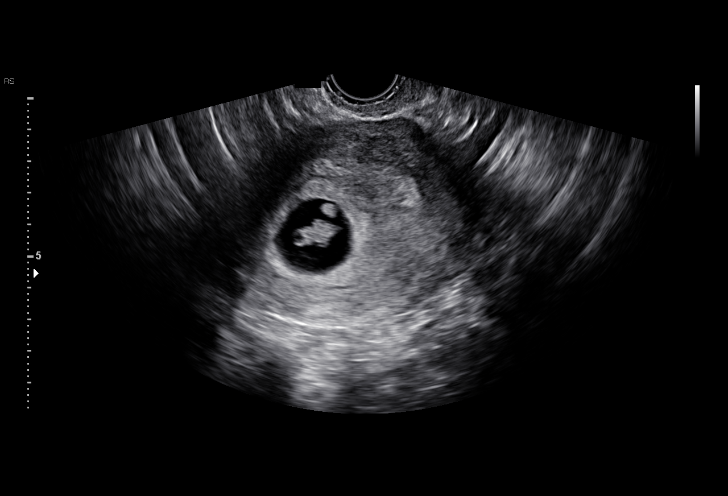
[im 53/75]
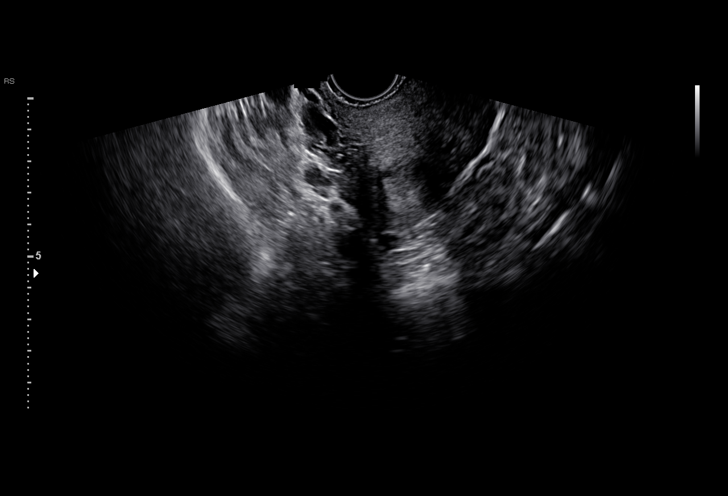
[im 58/75]
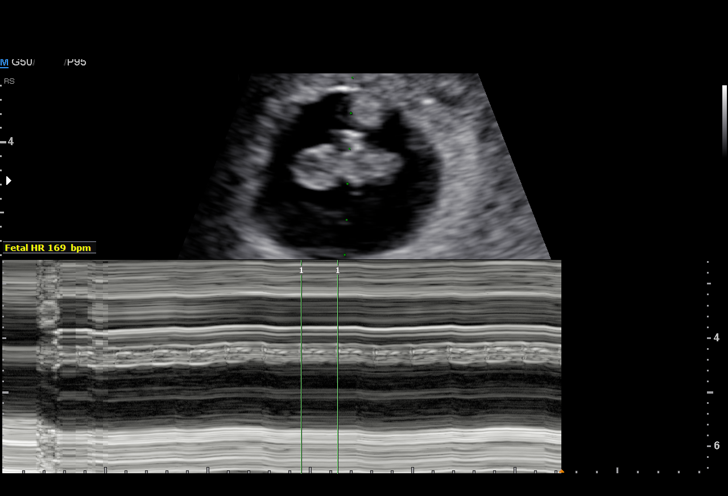
[im 64/75]
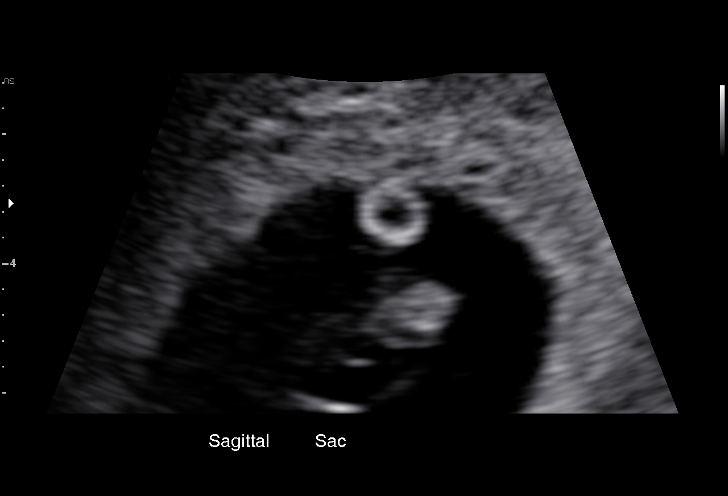
[im 69/75]
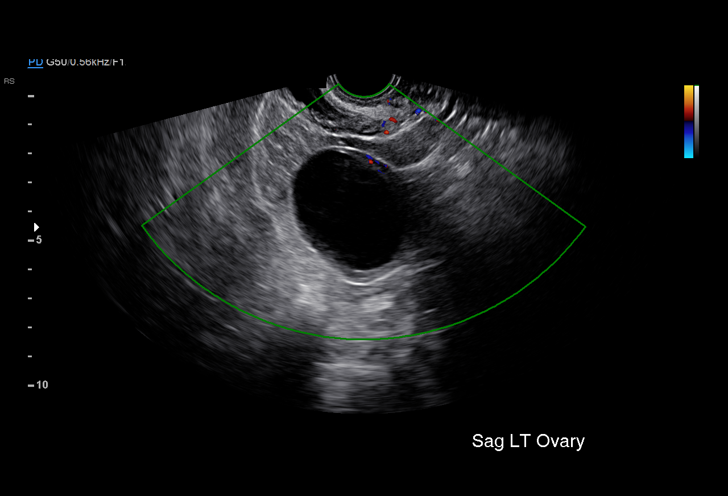
[im 75/75]
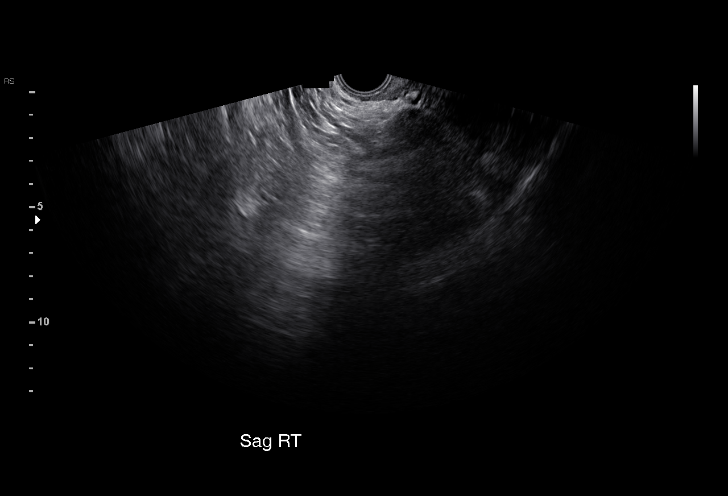

[15 of 28 positions shown; findings below may reference images not displayed]

FINDINGS: Intrauterine gestational sac: Single

Yolk sac:  Visualized.

Embryo:  Visualized.

Cardiac Activity: Visualized.

Heart Rate: 169 bpm

CRL:  16 mm   8 w   0 d                  US EDC: 09/30/2018

Subchorionic hemorrhage:  None visualized.

Maternal uterus/adnexae: 4.3 cm simple left ovarian cyst.
Nonvisualization of right ovary, however no mass or free fluid
identified.
IMPRESSION: Single living IUP measuring 8 weeks 0 days, with US EDC of
09/30/2018.

No significant maternal uterine or adnexal abnormality identified.

## 2020-08-12 ENCOUNTER — Encounter (HOSPITAL_COMMUNITY): Payer: Self-pay

## 2020-08-12 ENCOUNTER — Emergency Department (HOSPITAL_COMMUNITY)
Admission: EM | Admit: 2020-08-12 | Discharge: 2020-08-13 | Disposition: A | Discharge status: Home / Self Care | Payer: Self-pay | Attending: Emergency Medicine | Admitting: Emergency Medicine

## 2020-08-12 ENCOUNTER — Other Ambulatory Visit: Payer: Self-pay

## 2020-08-12 DIAGNOSIS — Z5321 Procedure and treatment not carried out due to patient leaving prior to being seen by health care provider: Secondary | ICD-10-CM | POA: Insufficient documentation

## 2020-08-12 DIAGNOSIS — R739 Hyperglycemia, unspecified: Secondary | ICD-10-CM | POA: Insufficient documentation

## 2020-08-12 LAB — URINALYSIS, ROUTINE W REFLEX MICROSCOPIC
Bacteria, UA: NONE SEEN
Bilirubin Urine: NEGATIVE
Glucose, UA: >=500 mg/dL — AB
Hgb urine dipstick: NEGATIVE
Ketones, ur: NEGATIVE mg/dL
Leukocytes,Ua: NEGATIVE
Nitrite: NEGATIVE
Protein, ur: NEGATIVE mg/dL
Specific Gravity, Urine: 1.015 (ref 1.005–1.030)
pH: 6 (ref 5.0–8.0)

## 2020-08-12 LAB — CBC
HCT: 38.4 % (ref 36.0–46.0)
Hemoglobin: 12.8 g/dL (ref 12.0–15.0)
MCH: 30.4 pg (ref 26.0–34.0)
MCHC: 33.3 g/dL (ref 30.0–36.0)
MCV: 91.2 fL (ref 80.0–100.0)
Platelets: 171 10*3/uL (ref 150–400)
RBC: 4.21 MIL/uL (ref 3.87–5.11)
RDW: 12.8 % (ref 11.5–15.5)
WBC: 7.3 10*3/uL (ref 4.0–10.5)
nRBC: 0 % (ref 0.0–0.2)

## 2020-08-12 LAB — BASIC METABOLIC PANEL
Anion gap: 12 (ref 5–15)
BUN: 11 mg/dL (ref 6–20)
CO2: 22 mmol/L (ref 22–32)
Calcium: 9.5 mg/dL (ref 8.9–10.3)
Chloride: 97 mmol/L — ABNORMAL LOW (ref 98–111)
Creatinine, Ser: 0.75 mg/dL (ref 0.44–1.00)
GFR, Estimated: >60 mL/min (ref >60)
Glucose, Bld: 410 mg/dL — ABNORMAL HIGH (ref 70–99)
Potassium: 4.2 mmol/L (ref 3.5–5.1)
Sodium: 131 mmol/L — ABNORMAL LOW (ref 135–145)

## 2020-08-12 LAB — I-STAT BETA HCG BLOOD, ED (MC, WL, AP ONLY): I-stat hCG, quantitative: <5 m[IU]/mL (ref <5)

## 2020-08-12 LAB — CBG MONITORING, ED: Glucose-Capillary: 390 mg/dL — ABNORMAL HIGH (ref 70–99)

## 2020-08-12 NOTE — ED Triage Notes (Signed)
Pt reports that she checked her sugar today and it was 523, pt reports that she is not diabetic but had gestational diabetes two years ago.

## 2020-08-13 NOTE — ED Notes (Signed)
No answer for vitals x 3  

## 2024-02-04 ENCOUNTER — Telehealth: Payer: Self-pay

## 2024-02-04 NOTE — Telephone Encounter (Signed)
 Telephoned patient at mobile number using interpreter#[phone]. Left a voice message with [facility] scheduling contact information. [facility]

## 2024-02-10 ENCOUNTER — Other Ambulatory Visit: Payer: Self-pay | Admitting: Obstetrics and Gynecology

## 2024-02-10 DIAGNOSIS — Z1231 Encounter for screening mammogram for malignant neoplasm of breast: Secondary | ICD-10-CM

## 2024-04-06 ENCOUNTER — Ambulatory Visit
Admission: RE | Admit: 2024-04-06 | Discharge: 2024-04-06 | Disposition: A | Discharge status: Home / Self Care | Payer: Self-pay | Source: Ambulatory Visit | Attending: Obstetrics and Gynecology | Admitting: Obstetrics and Gynecology

## 2024-04-06 ENCOUNTER — Ambulatory Visit: Payer: Self-pay

## 2024-04-06 ENCOUNTER — Ambulatory Visit: Payer: Self-pay | Admitting: *Deleted

## 2024-04-06 VITALS — BP 120/70 | Ht 61.0 in | Wt 241.1 lb

## 2024-04-06 DIAGNOSIS — Z1231 Encounter for screening mammogram for malignant neoplasm of breast: Secondary | ICD-10-CM

## 2024-04-06 DIAGNOSIS — Z1239 Encounter for other screening for malignant neoplasm of breast: Secondary | ICD-10-CM

## 2024-04-06 NOTE — Patient Instructions (Signed)
 Explained breast self awareness with Nena LITTIE Maki. Patient did not need a Pap smear today due to last Pap smear was 01/21/2023. Let her know that based on her last Pap smear result her next Pap smear is due in 3 years or June 2027. Referred patient to the [facility] for a screening mammogram on mobile unit. Appointment scheduled Thursday, April 06, 2024 at 1020. Patient aware of appointment and will be there. Let patient know the Breast Center will follow up with her within the next couple weeks with results of her mammogram by letter or phone. Nena LITTIE Maki verbalized understanding.  Melanni Benway, Wanda Ship, RN 9:45 AM

## 2024-04-06 NOTE — Progress Notes (Signed)
 [name] is a 43 y.o. female who presents to [facility] clinic today with no complaints.    Pap Smear: Pap smear not completed today. Last Pap smear was 01/21/2023 at [facility] clinic and was abnormal - ASCUS with negative HPV. Per patient has no history of an abnormal Pap smear prior to her most recent Pap smear. Last Pap smear result is available in Epic.   Physical exam: Breasts Right breast slightly larger than left breast that per patient is normal for her. No skin abnormalities bilateral breasts. No nipple retraction bilateral breasts. No nipple discharge bilateral breasts. No lymphadenopathy. No lumps palpated bilateral breasts. No complaints of pain or tenderness on exam.      Pelvic/Bimanual Pap is not indicated today per BCCCP guidelines.   Smoking History: Patient has never smoked.   Patient Navigation: Patient education provided. Access to services provided for patient through [facility] program. Spanish interpreter Meagan Grant from [facility] provided.   Breast and Cervical Cancer Risk Assessment: Patient has family history of her mother having breast cancer. Patient has no known genetic mutations or history of radiation treatment to the chest before age 63. Patient does not have history of cervical dysplasia, immunocompromised, or DES exposure in-utero.  Risk Scores as of Encounter on 04/06/2024     Meagan Grant           5-year 0.48%   Lifetime 7.95%   This patient is [place]/Latina but has no documented birth country, so the [place] model used data from [place] patients to calculate their risk score. Document a birth country in the Demographics activity for a more accurate score.         Last calculated by Logan Lyle BRAVO, CMA on 04/06/2024 at  9:49 AM        A: BCCCP exam without pap smear No complaints.  P: Referred patient to the [facility] for a screening mammogram on mobile unit. Appointment scheduled Thursday,  April 06, 2024 at 1020.  Driscilla Wanda SQUIBB, RN 04/06/2024 9:45 AM
